# Patient Record
Sex: Male | Born: 1980 | Race: Black or African American | Hispanic: No | Marital: Single | Smoking: Never smoker
Health system: Southern US, Community
[De-identification: ages and names within clinical notes are randomized; demographics above are authoritative.]

## PROBLEM LIST (undated history)

## (undated) DIAGNOSIS — I10 Essential (primary) hypertension: Secondary | ICD-10-CM

## (undated) DIAGNOSIS — E785 Hyperlipidemia, unspecified: Secondary | ICD-10-CM

## (undated) DIAGNOSIS — Z21 Asymptomatic human immunodeficiency virus [HIV] infection status: Secondary | ICD-10-CM

## (undated) DIAGNOSIS — B20 Human immunodeficiency virus [HIV] disease: Secondary | ICD-10-CM

## (undated) HISTORY — DX: Human immunodeficiency virus (HIV) disease: B20

## (undated) HISTORY — DX: Essential (primary) hypertension: I10

## (undated) HISTORY — DX: Hyperlipidemia, unspecified: E78.5

## (undated) HISTORY — DX: Asymptomatic human immunodeficiency virus (hiv) infection status: Z21

---

## 2007-05-27 ENCOUNTER — Ambulatory Visit: Payer: Self-pay | Admitting: Internal Medicine

## 2007-05-27 DIAGNOSIS — B20 Human immunodeficiency virus [HIV] disease: Secondary | ICD-10-CM | POA: Insufficient documentation

## 2007-05-27 DIAGNOSIS — J45909 Unspecified asthma, uncomplicated: Secondary | ICD-10-CM | POA: Insufficient documentation

## 2007-05-27 LAB — CONVERTED CEMR LAB
HCV Ab: NEGATIVE
HIV-1 antibody: POSITIVE — AB
HIV-2 Ab: NEGATIVE
HIV: REACTIVE
Hemoglobin, Urine: NEGATIVE
Hep B S Ab: POSITIVE — AB
Hepatitis B Surface Ag: NEGATIVE
Ketones, ur: NEGATIVE mg/dL
Leukocytes, UA: NEGATIVE
Nitrite: NEGATIVE
Protein, ur: NEGATIVE mg/dL
Urobilinogen, UA: 0.2 (ref 0.0–1.0)
pH: 7.5 (ref 5.0–8.0)

## 2007-06-12 ENCOUNTER — Ambulatory Visit: Payer: Self-pay | Admitting: Internal Medicine

## 2007-06-12 ENCOUNTER — Telehealth: Payer: Self-pay | Admitting: Internal Medicine

## 2007-06-25 ENCOUNTER — Telehealth: Payer: Self-pay | Admitting: Internal Medicine

## 2007-07-16 ENCOUNTER — Encounter: Admission: RE | Admit: 2007-07-16 | Discharge: 2007-07-16 | Payer: Self-pay | Admitting: Internal Medicine

## 2007-07-16 ENCOUNTER — Ambulatory Visit: Payer: Self-pay | Admitting: Internal Medicine

## 2007-07-16 LAB — CONVERTED CEMR LAB
ALT: 17 units/L (ref 0–53)
AST: 13 units/L (ref 0–37)
Basophils Absolute: 0 10*3/uL (ref 0.0–0.1)
Basophils Relative: 1 % (ref 0–1)
CO2: 24 meq/L (ref 19–32)
Calcium: 9.2 mg/dL (ref 8.4–10.5)
Chloride: 105 meq/L (ref 96–112)
Creatinine, Ser: 0.96 mg/dL (ref 0.40–1.50)
Hemoglobin: 13.6 g/dL (ref 13.0–17.0)
Lymphocytes Relative: 52 % — ABNORMAL HIGH (ref 12–46)
MCHC: 32.7 g/dL (ref 30.0–36.0)
Monocytes Absolute: 0.4 10*3/uL (ref 0.2–0.7)
Neutro Abs: 1.4 10*3/uL — ABNORMAL LOW (ref 1.7–7.7)
Neutrophils Relative %: 36 % — ABNORMAL LOW (ref 43–77)
Platelets: 223 10*3/uL (ref 150–400)
RDW: 13.9 % (ref 11.5–14.0)
Sodium: 139 meq/L (ref 135–145)
Total Protein: 7.3 g/dL (ref 6.0–8.3)

## 2007-07-21 ENCOUNTER — Telehealth: Payer: Self-pay | Admitting: Internal Medicine

## 2007-08-14 ENCOUNTER — Ambulatory Visit: Payer: Self-pay | Admitting: Internal Medicine

## 2007-08-20 ENCOUNTER — Telehealth: Payer: Self-pay | Admitting: Internal Medicine

## 2007-09-21 ENCOUNTER — Telehealth: Payer: Self-pay | Admitting: Internal Medicine

## 2007-09-23 ENCOUNTER — Encounter: Payer: Self-pay | Admitting: Internal Medicine

## 2007-09-30 ENCOUNTER — Encounter (INDEPENDENT_AMBULATORY_CARE_PROVIDER_SITE_OTHER): Payer: Self-pay | Admitting: *Deleted

## 2007-10-17 ENCOUNTER — Encounter: Payer: Self-pay | Admitting: Internal Medicine

## 2007-10-26 ENCOUNTER — Telehealth: Payer: Self-pay | Admitting: Internal Medicine

## 2007-11-09 ENCOUNTER — Ambulatory Visit: Payer: Self-pay | Admitting: Internal Medicine

## 2007-11-09 ENCOUNTER — Encounter: Admission: RE | Admit: 2007-11-09 | Discharge: 2007-11-09 | Payer: Self-pay | Admitting: Internal Medicine

## 2007-11-09 LAB — CONVERTED CEMR LAB
ALT: 18 units/L (ref 0–53)
AST: 14 units/L (ref 0–37)
Alkaline Phosphatase: 51 units/L (ref 39–117)
BUN: 10 mg/dL (ref 6–23)
Basophils Absolute: 0 10*3/uL (ref 0.0–0.1)
Basophils Relative: 1 % (ref 0–1)
Creatinine, Ser: 0.86 mg/dL (ref 0.40–1.50)
Eosinophils Absolute: 0.1 10*3/uL (ref 0.0–0.7)
Hemoglobin: 13.3 g/dL (ref 13.0–17.0)
MCHC: 31.9 g/dL (ref 30.0–36.0)
MCV: 89.7 fL (ref 78.0–100.0)
Monocytes Absolute: 0.4 10*3/uL (ref 0.1–1.0)
Monocytes Relative: 10 % (ref 3–12)
Neutrophils Relative %: 37 % — ABNORMAL LOW (ref 43–77)
RBC: 4.65 M/uL (ref 4.22–5.81)
RDW: 13.9 % (ref 11.5–15.5)
Total Bilirubin: 0.3 mg/dL (ref 0.3–1.2)

## 2007-11-23 ENCOUNTER — Telehealth: Payer: Self-pay | Admitting: Internal Medicine

## 2007-11-27 ENCOUNTER — Ambulatory Visit: Payer: Self-pay | Admitting: Internal Medicine

## 2007-12-22 ENCOUNTER — Telehealth: Payer: Self-pay | Admitting: Internal Medicine

## 2008-01-05 ENCOUNTER — Encounter (INDEPENDENT_AMBULATORY_CARE_PROVIDER_SITE_OTHER): Payer: Self-pay | Admitting: *Deleted

## 2008-01-20 ENCOUNTER — Telehealth (INDEPENDENT_AMBULATORY_CARE_PROVIDER_SITE_OTHER): Payer: Self-pay | Admitting: *Deleted

## 2008-02-25 ENCOUNTER — Ambulatory Visit: Payer: Self-pay | Admitting: Internal Medicine

## 2008-02-25 ENCOUNTER — Encounter: Admission: RE | Admit: 2008-02-25 | Discharge: 2008-02-25 | Payer: Self-pay | Admitting: Internal Medicine

## 2008-02-25 LAB — CONVERTED CEMR LAB
Albumin: 4.6 g/dL (ref 3.5–5.2)
Alkaline Phosphatase: 56 units/L (ref 39–117)
BUN: 13 mg/dL (ref 6–23)
Creatinine, Ser: 0.88 mg/dL (ref 0.40–1.50)
Eosinophils Absolute: 0.1 10*3/uL (ref 0.0–0.7)
Eosinophils Relative: 1 % (ref 0–5)
Glucose, Bld: 83 mg/dL (ref 70–99)
HCT: 42.1 % (ref 39.0–52.0)
HDL: 32 mg/dL — ABNORMAL LOW (ref >39)
Hemoglobin: 13.6 g/dL (ref 13.0–17.0)
Lymphs Abs: 2.9 10*3/uL (ref 0.7–4.0)
MCV: 88.4 fL (ref 78.0–100.0)
Monocytes Absolute: 0.5 10*3/uL (ref 0.1–1.0)
Monocytes Relative: 10 % (ref 3–12)
Neutrophils Relative %: 30 % — ABNORMAL LOW (ref 43–77)
Potassium: 4.4 meq/L (ref 3.5–5.3)
RBC: 4.76 M/uL (ref 4.22–5.81)
Triglycerides: 401 mg/dL — ABNORMAL HIGH (ref <150)
WBC: 4.9 10*3/uL (ref 4.0–10.5)

## 2008-03-02 ENCOUNTER — Telehealth (INDEPENDENT_AMBULATORY_CARE_PROVIDER_SITE_OTHER): Payer: Self-pay | Admitting: *Deleted

## 2008-03-11 ENCOUNTER — Ambulatory Visit: Payer: Self-pay | Admitting: Internal Medicine

## 2008-03-11 DIAGNOSIS — E782 Mixed hyperlipidemia: Secondary | ICD-10-CM | POA: Insufficient documentation

## 2008-04-21 ENCOUNTER — Telehealth (INDEPENDENT_AMBULATORY_CARE_PROVIDER_SITE_OTHER): Payer: Self-pay | Admitting: *Deleted

## 2008-05-18 ENCOUNTER — Telehealth (INDEPENDENT_AMBULATORY_CARE_PROVIDER_SITE_OTHER): Payer: Self-pay | Admitting: *Deleted

## 2008-06-21 ENCOUNTER — Telehealth (INDEPENDENT_AMBULATORY_CARE_PROVIDER_SITE_OTHER): Payer: Self-pay | Admitting: *Deleted

## 2008-06-23 ENCOUNTER — Ambulatory Visit: Payer: Self-pay | Admitting: Internal Medicine

## 2008-06-23 LAB — CONVERTED CEMR LAB
AST: 13 units/L (ref 0–37)
Alkaline Phosphatase: 48 units/L (ref 39–117)
BUN: 8 mg/dL (ref 6–23)
Basophils Relative: 0 % (ref 0–1)
Creatinine, Ser: 0.96 mg/dL (ref 0.40–1.50)
Eosinophils Absolute: 0.1 10*3/uL (ref 0.0–0.7)
HDL: 36 mg/dL — ABNORMAL LOW (ref >39)
Hemoglobin: 14 g/dL (ref 13.0–17.0)
LDL Cholesterol: 126 mg/dL — ABNORMAL HIGH (ref 0–99)
MCHC: 33.4 g/dL (ref 30.0–36.0)
MCV: 88.6 fL (ref 78.0–100.0)
Monocytes Absolute: 0.3 10*3/uL (ref 0.1–1.0)
Monocytes Relative: 9 % (ref 3–12)
Neutrophils Relative %: 37 % — ABNORMAL LOW (ref 43–77)
RBC: 4.73 M/uL (ref 4.22–5.81)
Total CHOL/HDL Ratio: 5.6

## 2008-07-08 ENCOUNTER — Ambulatory Visit: Payer: Self-pay | Admitting: Internal Medicine

## 2008-07-08 DIAGNOSIS — R030 Elevated blood-pressure reading, without diagnosis of hypertension: Secondary | ICD-10-CM

## 2008-07-11 ENCOUNTER — Encounter (INDEPENDENT_AMBULATORY_CARE_PROVIDER_SITE_OTHER): Payer: Self-pay | Admitting: *Deleted

## 2008-07-19 ENCOUNTER — Telehealth (INDEPENDENT_AMBULATORY_CARE_PROVIDER_SITE_OTHER): Payer: Self-pay | Admitting: *Deleted

## 2008-08-16 ENCOUNTER — Telehealth (INDEPENDENT_AMBULATORY_CARE_PROVIDER_SITE_OTHER): Payer: Self-pay | Admitting: *Deleted

## 2008-09-19 ENCOUNTER — Telehealth (INDEPENDENT_AMBULATORY_CARE_PROVIDER_SITE_OTHER): Payer: Self-pay | Admitting: *Deleted

## 2008-10-03 ENCOUNTER — Ambulatory Visit: Payer: Self-pay | Admitting: Internal Medicine

## 2008-10-03 LAB — CONVERTED CEMR LAB
AST: 11 units/L (ref 0–37)
Albumin: 4.4 g/dL (ref 3.5–5.2)
Alkaline Phosphatase: 44 units/L (ref 39–117)
BUN: 15 mg/dL (ref 6–23)
Basophils Relative: 0 % (ref 0–1)
Eosinophils Absolute: 0.1 10*3/uL (ref 0.0–0.7)
Eosinophils Relative: 2 % (ref 0–5)
HDL: 29 mg/dL — ABNORMAL LOW (ref >39)
MCHC: 32.3 g/dL (ref 30.0–36.0)
MCV: 90.5 fL (ref 78.0–100.0)
Monocytes Relative: 8 % (ref 3–12)
Neutrophils Relative %: 36 % — ABNORMAL LOW (ref 43–77)
Platelets: 237 10*3/uL (ref 150–400)
Potassium: 4.1 meq/L (ref 3.5–5.3)
RBC: 4.72 M/uL (ref 4.22–5.81)
Total Bilirubin: 0.3 mg/dL (ref 0.3–1.2)
Total CHOL/HDL Ratio: 7.1

## 2008-10-13 ENCOUNTER — Telehealth (INDEPENDENT_AMBULATORY_CARE_PROVIDER_SITE_OTHER): Payer: Self-pay | Admitting: *Deleted

## 2008-10-21 ENCOUNTER — Ambulatory Visit: Payer: Self-pay | Admitting: Internal Medicine

## 2008-11-07 ENCOUNTER — Telehealth (INDEPENDENT_AMBULATORY_CARE_PROVIDER_SITE_OTHER): Payer: Self-pay | Admitting: *Deleted

## 2008-11-14 ENCOUNTER — Telehealth (INDEPENDENT_AMBULATORY_CARE_PROVIDER_SITE_OTHER): Payer: Self-pay | Admitting: *Deleted

## 2008-11-14 ENCOUNTER — Encounter (INDEPENDENT_AMBULATORY_CARE_PROVIDER_SITE_OTHER): Payer: Self-pay | Admitting: *Deleted

## 2008-12-13 ENCOUNTER — Telehealth (INDEPENDENT_AMBULATORY_CARE_PROVIDER_SITE_OTHER): Payer: Self-pay | Admitting: *Deleted

## 2009-01-09 ENCOUNTER — Telehealth (INDEPENDENT_AMBULATORY_CARE_PROVIDER_SITE_OTHER): Payer: Self-pay | Admitting: *Deleted

## 2009-01-19 ENCOUNTER — Ambulatory Visit: Payer: Self-pay | Admitting: Internal Medicine

## 2009-01-19 LAB — CONVERTED CEMR LAB
Alkaline Phosphatase: 44 units/L (ref 39–117)
Eosinophils Relative: 1 % (ref 0–5)
GFR calc non Af Amer: >60 mL/min (ref >60)
Glucose, Bld: 99 mg/dL (ref 70–99)
HCT: 40.4 % (ref 39.0–52.0)
HDL: 33 mg/dL — ABNORMAL LOW (ref >39)
Hemoglobin: 13.4 g/dL (ref 13.0–17.0)
LDL Cholesterol: 123 mg/dL — ABNORMAL HIGH (ref 0–99)
Lymphocytes Relative: 65 % — ABNORMAL HIGH (ref 12–46)
Lymphs Abs: 2.7 10*3/uL (ref 0.7–4.0)
Monocytes Absolute: 0.3 10*3/uL (ref 0.1–1.0)
Monocytes Relative: 8 % (ref 3–12)
RBC: 4.68 M/uL (ref 4.22–5.81)
Sodium: 137 meq/L (ref 135–145)
Total Bilirubin: 0.3 mg/dL (ref 0.3–1.2)
Total Protein: 7.5 g/dL (ref 6.0–8.3)
Triglycerides: 170 mg/dL — ABNORMAL HIGH (ref <150)
VLDL: 34 mg/dL (ref 0–40)
WBC: 4.2 10*3/uL (ref 4.0–10.5)

## 2009-02-03 ENCOUNTER — Ambulatory Visit: Payer: Self-pay | Admitting: Internal Medicine

## 2009-02-07 ENCOUNTER — Telehealth (INDEPENDENT_AMBULATORY_CARE_PROVIDER_SITE_OTHER): Payer: Self-pay | Admitting: *Deleted

## 2009-02-09 ENCOUNTER — Telehealth (INDEPENDENT_AMBULATORY_CARE_PROVIDER_SITE_OTHER): Payer: Self-pay | Admitting: *Deleted

## 2009-03-08 ENCOUNTER — Telehealth (INDEPENDENT_AMBULATORY_CARE_PROVIDER_SITE_OTHER): Payer: Self-pay | Admitting: *Deleted

## 2009-04-07 ENCOUNTER — Telehealth (INDEPENDENT_AMBULATORY_CARE_PROVIDER_SITE_OTHER): Payer: Self-pay | Admitting: *Deleted

## 2009-05-08 ENCOUNTER — Telehealth (INDEPENDENT_AMBULATORY_CARE_PROVIDER_SITE_OTHER): Payer: Self-pay | Admitting: *Deleted

## 2009-05-18 ENCOUNTER — Telehealth (INDEPENDENT_AMBULATORY_CARE_PROVIDER_SITE_OTHER): Payer: Self-pay | Admitting: *Deleted

## 2009-06-02 ENCOUNTER — Telehealth (INDEPENDENT_AMBULATORY_CARE_PROVIDER_SITE_OTHER): Payer: Self-pay | Admitting: *Deleted

## 2009-07-05 ENCOUNTER — Telehealth (INDEPENDENT_AMBULATORY_CARE_PROVIDER_SITE_OTHER): Payer: Self-pay | Admitting: *Deleted

## 2009-08-01 ENCOUNTER — Telehealth (INDEPENDENT_AMBULATORY_CARE_PROVIDER_SITE_OTHER): Payer: Self-pay | Admitting: *Deleted

## 2009-08-10 ENCOUNTER — Ambulatory Visit: Payer: Self-pay | Admitting: Internal Medicine

## 2009-08-10 LAB — CONVERTED CEMR LAB
Albumin: 4.3 g/dL (ref 3.5–5.2)
Alkaline Phosphatase: 47 units/L (ref 39–117)
BUN: 11 mg/dL (ref 6–23)
Basophils Relative: 1 % (ref 0–1)
Cholesterol: 200 mg/dL (ref 0–200)
Glucose, Bld: 116 mg/dL — ABNORMAL HIGH (ref 70–99)
HDL: 37 mg/dL — ABNORMAL LOW (ref >39)
HIV-1 RNA Quant, Log: 1.87 — ABNORMAL HIGH (ref <1.68)
Hemoglobin: 13.5 g/dL (ref 13.0–17.0)
Lymphs Abs: 1.7 10*3/uL (ref 0.7–4.0)
MCHC: 31.5 g/dL (ref 30.0–36.0)
MCV: 91.3 fL (ref >=78.0)
Monocytes Absolute: 0.3 10*3/uL (ref 0.1–1.0)
Monocytes Relative: 10 % (ref 3–12)
Neutro Abs: 1.1 10*3/uL — ABNORMAL LOW (ref 1.7–7.7)
RBC: 4.7 M/uL (ref 4.22–5.81)
Total Bilirubin: 0.3 mg/dL (ref 0.3–1.2)
Total CHOL/HDL Ratio: 5.4
VLDL: 29 mg/dL (ref 0–40)
WBC: 3.3 10*3/uL — ABNORMAL LOW (ref 4.0–10.5)

## 2009-09-01 ENCOUNTER — Ambulatory Visit: Payer: Self-pay | Admitting: Internal Medicine

## 2009-09-04 ENCOUNTER — Encounter (INDEPENDENT_AMBULATORY_CARE_PROVIDER_SITE_OTHER): Payer: Self-pay | Admitting: *Deleted

## 2009-10-06 ENCOUNTER — Telehealth (INDEPENDENT_AMBULATORY_CARE_PROVIDER_SITE_OTHER): Payer: Self-pay | Admitting: *Deleted

## 2009-10-31 ENCOUNTER — Telehealth (INDEPENDENT_AMBULATORY_CARE_PROVIDER_SITE_OTHER): Payer: Self-pay | Admitting: *Deleted

## 2010-01-04 ENCOUNTER — Telehealth (INDEPENDENT_AMBULATORY_CARE_PROVIDER_SITE_OTHER): Payer: Self-pay | Admitting: *Deleted

## 2010-02-13 ENCOUNTER — Encounter (INDEPENDENT_AMBULATORY_CARE_PROVIDER_SITE_OTHER): Payer: Self-pay | Admitting: *Deleted

## 2010-02-28 ENCOUNTER — Ambulatory Visit: Payer: Self-pay | Admitting: Internal Medicine

## 2010-02-28 LAB — CONVERTED CEMR LAB
HIV 1 RNA Quant: <48 copies/mL (ref <48)
HIV-1 RNA Quant, Log: <1.68 (ref <1.68)

## 2010-03-01 ENCOUNTER — Encounter: Payer: Self-pay | Admitting: Internal Medicine

## 2010-03-01 LAB — CONVERTED CEMR LAB
ALT: 11 units/L (ref 0–53)
Basophils Absolute: 0.1 10*3/uL (ref 0.0–0.1)
Basophils Relative: 1 % (ref 0–1)
CO2: 22 meq/L (ref 19–32)
Calcium: 8.4 mg/dL (ref 8.4–10.5)
Eosinophils Absolute: 0.1 10*3/uL (ref 0.0–0.7)
Eosinophils Relative: 2 % (ref 0–5)
HCT: 38.1 % — ABNORMAL LOW (ref 39.0–52.0)
Hemoglobin: 12.5 g/dL — ABNORMAL LOW (ref 13.0–17.0)
Lymphocytes Relative: 46 % (ref 12–46)
MCHC: 32.8 g/dL (ref 30.0–36.0)
MCV: 88 fL (ref 78.0–100.0)
Monocytes Absolute: 0.5 10*3/uL (ref 0.1–1.0)
Platelets: 278 10*3/uL (ref 150–400)
RDW: 13.4 % (ref 11.5–15.5)
Total Bilirubin: 0.2 mg/dL — ABNORMAL LOW (ref 0.3–1.2)
Total Protein: 6.9 g/dL (ref 6.0–8.3)

## 2010-03-16 ENCOUNTER — Ambulatory Visit: Payer: Self-pay | Admitting: Internal Medicine

## 2010-04-16 ENCOUNTER — Ambulatory Visit: Payer: Self-pay | Admitting: Internal Medicine

## 2010-04-16 DIAGNOSIS — R21 Rash and other nonspecific skin eruption: Secondary | ICD-10-CM | POA: Insufficient documentation

## 2010-04-17 ENCOUNTER — Encounter: Payer: Self-pay | Admitting: Internal Medicine

## 2010-04-17 LAB — CONVERTED CEMR LAB
Basophils Absolute: 0 10*3/uL (ref 0.0–0.1)
Basophils Relative: 1 % (ref 0–1)
Hemoglobin: 12.9 g/dL — ABNORMAL LOW (ref 13.0–17.0)
MCHC: 31.7 g/dL (ref 30.0–36.0)
Monocytes Absolute: 0.6 10*3/uL (ref 0.1–1.0)
Neutro Abs: 2.7 10*3/uL (ref 1.7–7.7)
RDW: 14.1 % (ref 11.5–15.5)

## 2010-04-18 ENCOUNTER — Ambulatory Visit: Payer: Self-pay | Admitting: Internal Medicine

## 2010-04-19 ENCOUNTER — Encounter: Payer: Self-pay | Admitting: Internal Medicine

## 2010-04-25 ENCOUNTER — Ambulatory Visit: Payer: Self-pay | Admitting: Internal Medicine

## 2010-04-25 LAB — CONVERTED CEMR LAB: RPR Ser Ql: REACTIVE — AB

## 2010-05-04 ENCOUNTER — Telehealth: Payer: Self-pay

## 2010-06-20 ENCOUNTER — Telehealth (INDEPENDENT_AMBULATORY_CARE_PROVIDER_SITE_OTHER): Payer: Self-pay | Admitting: *Deleted

## 2010-06-21 ENCOUNTER — Encounter: Payer: Self-pay | Admitting: Internal Medicine

## 2010-06-22 ENCOUNTER — Ambulatory Visit: Payer: Self-pay | Admitting: Internal Medicine

## 2010-06-22 LAB — CONVERTED CEMR LAB
RPR Titer: 1:32 {titer} — AB
T pallidum Antibodies (TP-PA): >8 — ABNORMAL HIGH (ref <0.90)

## 2010-06-25 ENCOUNTER — Telehealth (INDEPENDENT_AMBULATORY_CARE_PROVIDER_SITE_OTHER): Payer: Self-pay | Admitting: *Deleted

## 2010-06-26 ENCOUNTER — Ambulatory Visit: Payer: Self-pay | Admitting: Internal Medicine

## 2010-06-26 DIAGNOSIS — A539 Syphilis, unspecified: Secondary | ICD-10-CM

## 2010-07-04 ENCOUNTER — Ambulatory Visit: Payer: Self-pay | Admitting: Internal Medicine

## 2010-07-10 ENCOUNTER — Ambulatory Visit: Payer: Self-pay | Admitting: Internal Medicine

## 2010-08-06 ENCOUNTER — Telehealth: Payer: Self-pay

## 2010-10-30 NOTE — Miscellaneous (Signed)
Summary: Orders Update  Clinical Lists Changes  Orders: Added new Test order of T-Culture, Blood Routine ([id number]) - Signed     Process Orders Check Orders Results:     [facility]: ABN not required for this insurance Tests Sent for requisitioning (April 17, 2010 4:17 PM):     04/17/2010: [facility] -- T-Culture, Blood Routine [[id number]] (signed)

## 2010-10-30 NOTE — Progress Notes (Signed)
Summary: Request for antibiotic  Phone Note Call from Patient Call back at Home Phone [phone]   Caller: Patient Details for Reason: URI-Upper Respiratory Infection Summary of Call: Patient left message on voicemal in old clinic requesting an antibiotic because he thinks he has an URI.  Patient states that he has a bad cold that comes and goes.  Please call patient back at his home number. Initial call taken by: Paulo Fruit  BS,CPht II,MPH,  January 04, 2010 9:33 AM  Follow-up for Phone Call        needs appt for eval Follow-up by: Yisroel Ramming MD,  January 04, 2010 9:50 AM  Additional Follow-up for Phone Call Additional follow up Details #1::        Called pt. and offered appt for the week of 01/01/10 or pt. could be seen at urgent care.  Pt. declined to schedule appt. Additional Follow-up by: Wendall Mola CMA Duncan Dull),  January 09, 2010 9:39 AM

## 2010-10-30 NOTE — Miscellaneous (Signed)
Summary: Orders Update  Clinical Lists Changes  Orders: Added new Test order of T-RPR (Syphilis) [phone]) - Signed     Process Orders Check Orders Results:     [facility]: ABN not required for this insurance Tests Sent for requisitioning (June 21, 2010 11:45 AM):     06/21/2010: [facility] -- T-RPR (Syphilis) [phone] (signed)

## 2010-10-30 NOTE — Progress Notes (Signed)
Summary: repeat RPR  Phone Note Call from Patient   Caller: Patient Summary of Call: Pt. c/o of spots on feet that never cleared. I told him that can take quite awhile.  He said he just does not feel right, and wants to know if his RPR can be repeated or if possibly he needed more than the one dose fo PCN Initial call taken by: Wendall Mola CMA Duncan Dull),  June 20, 2010 5:03 PM  Follow-up for Phone Call        ok to repeat RPR Follow-up by: Yisroel Ramming MD,  June 20, 2010 6:09 PM     Appended Document: repeat RPR pt. notified, order put in for lab

## 2010-10-30 NOTE — Progress Notes (Signed)
Summary: Needs nurse visit  Phone Note Outgoing Call   Call placed by: Annice Pih Summary of Call: Called pt. to let him know to schedule nurse visit because he needs Bicillian x 3 weeks Initial call taken by: Wendall Mola CMA Duncan Dull),  June 25, 2010 12:39 PM

## 2010-10-30 NOTE — Assessment & Plan Note (Signed)
Summary: flu shot [mkj  Prior Medications: SUSTIVA 600 MG  TABS (EFAVIRENZ) Take 1 tablet by mouth at bedtime EPZICOM 600-300 MG  TABS (ABACAVIR SULFATE-LAMIVUDINE) Take 1 tablet by mouth once a day CLARITIN 10 MG TABS (LORATADINE) Take 1 tablet by mouth once a day PROAIR HFA 108 (90 BASE) MCG/ACT AERS (ALBUTEROL SULFATE) Inhale 1 puff every 6 hours as needed Current Allergies: No known allergies  Immunizations Administered:  Influenza Vaccine # 1:    Vaccine Type: Fluvax Non-MCR    Site: right deltoid    Mfr: Novartis    Dose: 0.5 ml    Route: IM    Given by: Wendall Mola CMA ( AAMA)    Exp. Date: 12/30/2010    Lot #: [id number]    VIS given: 04/24/10 version given June 22, 2010.  Flu Vaccine Consent Questions:    Do you have a history of severe allergic reactions to this vaccine? no    Any prior history of allergic reactions to egg and/or gelatin? no    Do you have a sensitivity to the preservative Thimersol? no    Do you have a past history of Guillan-Barre Syndrome? no    Do you currently have an acute febrile illness? no    Have you ever had a severe reaction to latex? no    Vaccine information given and explained to patient? yes  Orders Added: 1)  Influenza Vaccine NON MCR [00028]

## 2010-10-30 NOTE — Assessment & Plan Note (Signed)
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)    Route: IM    Site: RUOQ gluteus    Exp Date: 01/28/2013    Lot #: [id number]    Mfr: king    Patient tolerated injection without complications    Given by: Starleen Arms CMA (July 10, 2010 4:20 PM)  Injection # 2:    Medication: Bicillin LA 1.2 million units Injection    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)    Route: IM    Site: LUOQ gluteus    Exp Date: 01/28/2013    Lot #: [id number]    Mfr: king    Patient tolerated injection without complications    Given by: Starleen Arms CMA (July 10, 2010 4:20 PM)  Orders Added: 1)  Bicillin LA 1.2 million units Injection [J0561] 2)  Bicillin LA 1.2 million units Injection [J0561] 3)  Admin of Therapeutic Inj  intramuscular or subcutaneous [96372]   Medication Administration  Injection # 1:    Medication: Bicillin LA 1.2 million units Injection    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)    Route: IM    Site: RUOQ gluteus    Exp Date: 01/28/2013    Lot #: [id number]    Mfr: king    Patient tolerated injection without complications    Given by: Starleen Arms CMA (July 10, 2010 4:20 PM)  Injection # 2:    Medication: Bicillin LA 1.2 million units Injection    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

## 2010-10-30 NOTE — Assessment & Plan Note (Signed)
Summary: SICK/ MEDICATION PROBLEM[MKJ]   Primary Provider:  Yisroel Ramming  CC:  pt. c/o not feeling well, low grade fever, dark spots palms and soles of feet, and and genital area.  History of Present Illness: Pt c/o low grade temperature and spots on his feet and hands for a couple of weeks.  He did have a slight HA - no stifd neck.  He has just not felt himself. No tick bite that he is aware of. He did have unprotected sex about a month ago.  No urethral discharge.  Preventive Screening-Counseling & Management  Alcohol-Tobacco     Alcohol drinks/day: socially     Smoking Status: never  Caffeine-Diet-Exercise     Caffeine use/day: coffee, tea, soda (occasional)     Does Patient Exercise: no  Safety-Violence-Falls     Seat Belt Use: yes     Seat Belt Counseling: to use seat belts when in vehicle      Sexual History:  dating.    Comments: pt. declined condoms   Updated Prior Medication List: SUSTIVA 600 MG  TABS (EFAVIRENZ) Take 1 tablet by mouth at bedtime EPZICOM 600-300 MG  TABS (ABACAVIR SULFATE-LAMIVUDINE) Take 1 tablet by mouth once a day CLARITIN 10 MG TABS (LORATADINE) Take 1 tablet by mouth once a day PROAIR HFA 108 (90 BASE) MCG/ACT AERS (ALBUTEROL SULFATE) Inhale 1 puff every 6 hours as needed  Current Allergies (reviewed today): No known allergies  Past History:  Past Medical History: Last updated: 03/11/2008 Asthma HIV disease Hyperlipidemia  Social History: Sexual History:  dating  Review of Systems       The patient complains of fever.  The patient denies anorexia and weight loss.    Vital Signs:  Patient profile:   30 year old male Height:      66 inches (167.64 cm) Weight:      180.0 pounds (81.82 kg) BMI:     29.16 Temp:     98.9 degrees F (37.17 degrees C) oral Pulse rate:   99 / minute BP sitting:   152 / 95  (left arm)  Vitals Entered By: Wendall Mola CMA Duncan Dull) (April 16, 2010 2:54 PM) CC: pt. c/o not feeling well, low grade  fever, dark spots palms and soles of feet, and genital area Is Patient Diabetic? No Pain Assessment Patient in pain? yes     Location: right shoulder Intensity: 4 Type: aching Onset of pain  Intermittent Nutritional Status BMI of 25 - 29 = overweight Nutritional Status Detail appetite "normal"  Have you ever been in a relationship where you felt threatened, hurt or afraid?No   Does patient need assistance? Functional Status Self care Ambulation Normal Comments no missed doses of meds per patient    Physical Exam  General:  alert, well-developed, well-nourished, and well-hydrated.   Head:  normocephalic and atraumatic.   Mouth:  pharynx pink and moist.   Neck:  supple.   Lungs:  normal breath sounds.   Genitalia:  no ulcerations or discharge some flat hperpigmented lesions Msk:  several hyperpigmented lesions on hands and feet   Impression & Recommendations:  Problem # 1:  SKIN RASH (ICD-782.1) on hands and feet primarily with low grade fever, not toxic looking no history of tick bite so less like RMSF not toxic so do noth think it is meningococcemia most like syphilis - will check the following could be an enterovirus pt to call if severe HA or high fever Orders: T-RPR (Syphilis) (24401-02725) T- Sed  rate non-auto (40981) T-[place] Spotted Fever AB IgG IgM ([id number]) Est. Patient Level IV (21308) T-CBC w/Diff ([id number])  Patient Instructions: 1)  will call with results

## 2010-10-30 NOTE — Miscellaneous (Signed)
Summary: clinical update/ryan white apprv til 12/29/10  Clinical Lists Changes  Observations: Added new observation of AIDSDAP: Yes 2011 (02/13/2010 11:19)

## 2010-10-30 NOTE — Progress Notes (Signed)
Summary: [place] assist meds arrived for Feb  Phone Note Refill Request      Prescriptions: EPZICOM 600-300 MG  TABS (ABACAVIR SULFATE-LAMIVUDINE) Take 1 tablet by mouth once a day  #30 x 0   Entered by:   Paulo Fruit  BS,CPht II,MPH   Authorized by:   Yisroel Ramming MD   Signed by:   Paulo Fruit  BS,CPht II,MPH on 10/31/2009   Method used:   Samples Given   RxID:   [id number] SUSTIVA 600 MG  TABS (EFAVIRENZ) Take 1 tablet by mouth at bedtime  #30 x 0   Entered by:   Paulo Fruit  BS,CPht II,MPH   Authorized by:   Yisroel Ramming MD   Signed by:   Paulo Fruit  BS,CPht II,MPH on 10/31/2009   Method used:   Samples Given   RxID:   [id number]   Patient Assist Medication Verification: Medication: sustiva 600mg  QIO#[id number] Exp Date:Nov 2013 Tech approval:MLD                Patient Assist Medication Verification: Medication:Epzicom 600mg /300mg  XLK#[id number] Exp Date:Aug 2013 Tech approval:MLD Call placed to patient with message that assistance medications are ready for pick-up. Left message on patient's VM Paulo Fruit  BS,CPht II,MPH  October 31, 2009 11:50 AM

## 2010-10-30 NOTE — Progress Notes (Signed)
Summary: [facility] Call[facility] re: reactive RPR  Phone Note Other Incoming   Caller: Elfredia Nevins  Summary of Call: [facility] calling to see if pt has been treated for reactive RPR. They were informed date treated and medication given.  Tomasita Morrow RN  May 04, 2010 3:46 PM

## 2010-10-30 NOTE — Assessment & Plan Note (Signed)
Summary: 63month f/u/vs   Primary Provider:  Yisroel Ramming  CC:  follow-up visit and lab results.  History of Present Illness: Pt is concerned about the fat in his abdomen.  He wants to lose weight. He feels well and has not missed any doses of his meds.  Preventive Screening-Counseling & Management  Alcohol-Tobacco     Alcohol drinks/day: 0     Smoking Status: never  Caffeine-Diet-Exercise     Caffeine use/day: coffee, tea, soda (occasional)     Does Patient Exercise: no  Safety-Violence-Falls     Seat Belt Use: no     Seat Belt Counseling: to use seat belts when in vehicle      Sexual History:  n/a.    Comments: pt. given condoms   Updated Prior Medication List: SUSTIVA 600 MG  TABS (EFAVIRENZ) Take 1 tablet by mouth at bedtime EPZICOM 600-300 MG  TABS (ABACAVIR SULFATE-LAMIVUDINE) Take 1 tablet by mouth once a day CLARITIN 10 MG TABS (LORATADINE) Take 1 tablet by mouth once a day PROAIR HFA 108 (90 BASE) MCG/ACT AERS (ALBUTEROL SULFATE) Inhale 1 puff every 6 hours as needed  Current Allergies (reviewed today): No known allergies  Past History:  Past Medical History: Last updated: 03/11/2008 Asthma HIV disease Hyperlipidemia  Social History: Sexual History:  n/a  Review of Systems       The patient complains of weight gain.  The patient denies fever and abdominal pain.    Vital Signs:  Patient profile:   30 year old male Height:      66 inches (167.64 cm) Weight:      181.12 pounds (82.33 kg) BMI:     29.34 Temp:     98.3 degrees F (36.83 degrees C) oral Pulse rate:   87 / minute BP sitting:   115 / 81  (right arm)  Vitals Entered By: Wendall Mola CMA Duncan Dull) (March 16, 2010 10:12 AM) CC: follow-up visit, lab results Is Patient Diabetic? No Pain Assessment Patient in pain? no      Nutritional Status BMI of 25 - 29 = overweight Nutritional Status Detail appetite "good"  Does patient need assistance? Functional Status Self  care Ambulation Normal Comments no missed doses of meds per patient   Physical Exam  General:  alert, well-developed, well-nourished, and well-hydrated.   Head:  normocephalic and atraumatic.   Mouth:  pharynx pink and moist.   Lungs:  normal breath sounds.          Medication Adherence: 03/16/2010   Adherence to medications reviewed with patient. Counseling to provide adequate adherence provided   Prevention For Positives: 03/16/2010   Safe sex practices discussed with patient. Condoms offered.                             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  Pt instructed to continue the current antiretroviral regimen.  Pt encouraged to take medication regularly and not miss doses.  Pt will f/u in 3 months for repeat blood work and will see me 2 weeks later.  Diagnostics Reviewed:  HIV: REACTIVE (05/27/2007)   HIV-Western blot: Positive (05/27/2007)   CD4: 690 (03/01/2010)   WBC: 4.3 (03/01/2010)   Hgb: 12.5 (03/01/2010)   HCT: 38.1 (03/01/2010)   Platelets: 278 (03/01/2010) HIV-1 RNA: <48 copies/mL (02/28/2010)   HBSAg: NEG (05/27/2007)  Other Orders: Est.  Patient Level III [phone]) Future Orders: T-CD4SP ([facility]) (CD4SP) ... 09/12/2010 T-HIV Viral Load [phone]) ... 09/12/2010 T-Comprehensive Metabolic Panel [phone]) ... 09/12/2010 T-CBC w/Diff ([id number]) ... 09/12/2010 T-Lipid Profile [phone]) ... 09/12/2010  Patient Instructions: 1)  Please schedule a follow-up appointment in 6 months, 2 weeks after labs.

## 2010-10-30 NOTE — Assessment & Plan Note (Signed)
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)    Route: IM    Site: RUOQ gluteus    Exp Date: 01/28/2013    Lot #: [id number]    Mfr: king    Patient tolerated injection without complications    Given by: Starleen Arms CMA (July 04, 2010 4:22 PM)  Injection # 2:    Medication: Bicillin LA 1.2 million units Injection    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)    Route: IM    Site: LUOQ gluteus    Exp Date: 01/28/2013    Lot #: [id number]    Mfr: king    Patient tolerated injection without complications    Given by: Starleen Arms CMA (July 04, 2010 4:23 PM)  Orders Added: 1)  Bicillin LA 1.2 million units Injection [J0561] 2)  Bicillin LA 1.2 million units Injection [J0561] 3)  Admin of Therapeutic Inj  intramuscular or subcutaneous [96372]    Medication Administration  Injection # 1:    Medication: Bicillin LA 1.2 million units Injection    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)    Route: IM    Site: RUOQ gluteus    Exp Date: 01/28/2013    Lot #: [id number]    Mfr: king    Patient tolerated injection without complications    Given by: Starleen Arms CMA (July 04, 2010 4:22 PM)  Injection # 2:    Medication: Bicillin LA 1.2 million units Injection    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

## 2010-10-30 NOTE — Assessment & Plan Note (Signed)
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

## 2010-10-30 NOTE — Progress Notes (Signed)
Summary: [place] assist meds arrived for Jan  Phone Note Refill Request      Prescriptions: EPZICOM 600-300 MG  TABS (ABACAVIR SULFATE-LAMIVUDINE) Take 1 tablet by mouth once a day  #30 x 0   Entered by:   Paulo Fruit  BS,CPht II,MPH   Authorized by:   Yisroel Ramming MD   Signed by:   Paulo Fruit  BS,CPht II,MPH on 10/06/2009   Method used:   Samples Given   RxID:   [id number] SUSTIVA 600 MG  TABS (EFAVIRENZ) Take 1 tablet by mouth at bedtime  #30 x 0   Entered by:   Paulo Fruit  BS,CPht II,MPH   Authorized by:   Yisroel Ramming MD   Signed by:   Paulo Fruit  BS,CPht II,MPH on 10/06/2009   Method used:   Samples Given   RxID:   [id number]   Patient Assist Medication Verification: Medication:Epzicom 600/300mg  ZDG#[id number] Exp Date:Jul 2013 Tech approval:MLD                Patient Assist Medication Verification: Medication:Sustiva 600mg  Lot#[id number] Exp Date:Oct 2013 Tech approval:MLD Call placed to patient with message that assistance medications are ready for pick-up. Paulo Fruit  BS,CPht II,MPH  October 06, 2009 3:57 PM                 Appended Document: [place] assist meds arrived for Jan Prescription/Samples picked up by: patient

## 2010-10-30 NOTE — Assessment & Plan Note (Signed)
Summary: worsening rash/jc   Primary Provider:  Yisroel Ramming  CC:  pt. c/o worsening rash.  History of Present Illness: Pt continues with rash. No fever. He has felt fatigued and occasionally nauseated.  W/u has been negative other than an elevated ESR.  I will repeat the RPR today and treat him for what looks like syphilis with 2.4 million umits of PCN. Pt expressed understaning and agreement with the plan.  Preventive Screening-Counseling & Management  Alcohol-Tobacco     Alcohol drinks/day: socially     Smoking Status: never  Caffeine-Diet-Exercise     Caffeine use/day: coffee, tea, soda (occasional)     Does Patient Exercise: no  Safety-Violence-Falls     Seat Belt Use: yes     Seat Belt Counseling: to use seat belts when in vehicle      Sexual History:  dating.    Comments: pt. declined condoms   Updated Prior Medication List: SUSTIVA 600 MG  TABS (EFAVIRENZ) Take 1 tablet by mouth at bedtime EPZICOM 600-300 MG  TABS (ABACAVIR SULFATE-LAMIVUDINE) Take 1 tablet by mouth once a day CLARITIN 10 MG TABS (LORATADINE) Take 1 tablet by mouth once a day PROAIR HFA 108 (90 BASE) MCG/ACT AERS (ALBUTEROL SULFATE) Inhale 1 puff every 6 hours as needed  Current Allergies (reviewed today): No known allergies  Past History:  Past Medical History: Last updated: 03/11/2008 Asthma HIV disease Hyperlipidemia  Review of Systems  The patient denies fever, headaches, and abdominal pain.    Vital Signs:  Patient profile:   30 year old male Height:      66 inches (167.64 cm) Weight:      176.12 pounds (80.05 kg) BMI:     28.53 Temp:     98.4 degrees F (36.89 degrees C) oral Pulse rate:   106 / minute BP sitting:   151 / 93  (right arm)  Vitals Entered By: Wendall Mola CMA Duncan Dull) (April 25, 2010 2:58 PM) CC: pt. c/o worsening rash Is Patient Diabetic? No Pain Assessment Patient in pain? no      Nutritional Status BMI of 25 - 29 = overweight Nutritional Status  Detail appetite "good"  Does patient need assistance? Functional Status Self care Ambulation Normal Comments no missed doses of meds per patient   Physical Exam  General:  alert, well-developed, well-nourished, and well-hydrated.   Head:  normocephalic and atraumatic.   Skin:  hyperpigmented lesions on feet and hands   Impression & Recommendations:  Problem # 1:  SKIN RASH (ICD-782.1) Will treat for possible syphilis and repeat RPR.  Can have falsely negative RPRs. If rash persists and repeat RPR is negative will refer to Derm for biopsy. Orders: T-RPR (Syphilis) (24401-02725) Est. Patient Level III (99213) Bicillin LA 1.2 million units Injection (J0561) Bicillin LA 1.2 million units Injection (D6644) Admin of Therapeutic Inj  intramuscular or subcutaneous (03474)   Medication Administration  Injection # 1:    Medication: Bicillin LA 1.2 million units Injection    Diagnosis: SKIN RASH (ICD-782.1)    Route: IM    Site: RUOQ gluteus    Exp Date: 03/14    Lot #: [id number]    Mfr: king    Patient tolerated injection without complications    Given by: Wendall Mola CMA Duncan Dull) (April 25, 2010 4:07 PM)  Injection # 2:    Medication: Bicillin LA 1.2 million units Injection    Diagnosis: SKIN RASH (ICD-782.1)    Route: IM    Site: LUOQ  gluteus    Exp Date: 03/14    Lot #: [id number]    Mfr: king    Patient tolerated injection without complications    Given by: Wendall Mola CMA Duncan Dull) (April 25, 2010 4:07 PM)  Orders Added: 1)  T-RPR (Syphilis) [phone] 2)  Est. Patient Level III [91478] 3)  Bicillin LA 1.2 million units Injection [J0561] 4)  Bicillin LA 1.2 million units Injection [J0561] 5)  Admin of Therapeutic Inj  intramuscular or subcutaneous [29562]

## 2010-11-01 NOTE — Progress Notes (Signed)
  Phone Note Call from Patient   Caller: pat  Summary of Call: pt called to request syphillis testing.  Left message on machine. No answer upon return call.

## 2010-11-15 ENCOUNTER — Encounter: Payer: Self-pay | Admitting: Adult Health

## 2010-11-15 ENCOUNTER — Other Ambulatory Visit: Payer: Self-pay | Admitting: Adult Health

## 2010-11-15 ENCOUNTER — Other Ambulatory Visit (INDEPENDENT_AMBULATORY_CARE_PROVIDER_SITE_OTHER): Payer: Self-pay

## 2010-11-15 DIAGNOSIS — B20 Human immunodeficiency virus [HIV] disease: Secondary | ICD-10-CM

## 2010-11-15 LAB — CONVERTED CEMR LAB
BUN: 12 mg/dL (ref 6–23)
Blood, UA: NEGATIVE
CO2: 27 meq/L (ref 19–32)
Calcium: 9.4 mg/dL (ref 8.4–10.5)
Chloride: 104 meq/L (ref 96–112)
Cholesterol: 223 mg/dL — ABNORMAL HIGH (ref 0–200)
Creatinine, Ser: 0.77 mg/dL (ref 0.40–1.50)
Eosinophils Relative: 3 % (ref 0–5)
Glucose, Bld: 91 mg/dL (ref 70–99)
HCT: 40.8 % (ref 39.0–52.0)
Hemoglobin: 13.4 g/dL (ref 13.0–17.0)
Ketones, ur: NEGATIVE mg/dL
Lymphocytes Relative: 54 % — ABNORMAL HIGH (ref 12–46)
Lymphs Abs: 2.8 10*3/uL (ref 0.7–4.0)
Monocytes Absolute: 0.4 10*3/uL (ref 0.1–1.0)
Platelets: 199 10*3/uL (ref 150–400)
RPR Titer: 1:1 {titer}
Total CHOL/HDL Ratio: 7
Urine Glucose: NEGATIVE mg/dL
VLDL: 80 mg/dL — ABNORMAL HIGH (ref 0–40)
WBC: 5.2 10*3/uL (ref 4.0–10.5)
pH: 7 (ref 5.0–8.0)

## 2010-11-16 LAB — T-HELPER CELL (CD4) - (RCID CLINIC ONLY): CD4 T Cell Abs: 1120 uL (ref 400–2700)

## 2010-11-20 ENCOUNTER — Encounter (INDEPENDENT_AMBULATORY_CARE_PROVIDER_SITE_OTHER): Payer: Self-pay | Admitting: *Deleted

## 2010-11-27 NOTE — Miscellaneous (Signed)
  Clinical Lists Changes  Observations: Added new observation of HIV STATUS: HIV positive - not AIDS (11/20/2010 16:13)

## 2010-12-17 LAB — T-HELPER CELL (CD4) - (RCID CLINIC ONLY)
CD4 % Helper T Cell: 36 % (ref 33–55)
CD4 T Cell Abs: 690 uL (ref 400–2700)

## 2010-12-31 ENCOUNTER — Encounter: Payer: Self-pay | Admitting: Adult Health

## 2010-12-31 ENCOUNTER — Ambulatory Visit (INDEPENDENT_AMBULATORY_CARE_PROVIDER_SITE_OTHER): Payer: BC Managed Care – PPO | Admitting: Adult Health

## 2010-12-31 DIAGNOSIS — B20 Human immunodeficiency virus [HIV] disease: Secondary | ICD-10-CM

## 2010-12-31 DIAGNOSIS — E785 Hyperlipidemia, unspecified: Secondary | ICD-10-CM

## 2010-12-31 DIAGNOSIS — Z21 Asymptomatic human immunodeficiency virus [HIV] infection status: Secondary | ICD-10-CM

## 2010-12-31 DIAGNOSIS — K469 Unspecified abdominal hernia without obstruction or gangrene: Secondary | ICD-10-CM

## 2010-12-31 MED ORDER — OMEGA-3-ACID ETHYL ESTERS 1 G PO CAPS: 2.0000 g | ORAL_CAPSULE | Freq: Two times a day (BID) | ORAL | Status: DC

## 2010-12-31 NOTE — Progress Notes (Signed)
  Subjective:    Patient ID: Ryan [name], male    DOB: [birth year], 30 y.o.   MRN: [id number]  HPII In for lab f/u.  Basically feeling well.  Occasional episodes of mid lower abd pain while working out.  Hx of ventral hernia w/ou complication in same region.  Adherent to meds with good tolerance.    Review of Systems  Constitutional: Negative.   HENT: Negative.   Eyes: Negative.   Respiratory: Negative.   Cardiovascular: Negative.   Gastrointestinal: Positive for abdominal pain. Negative for nausea and vomiting.  Genitourinary: Negative.   Musculoskeletal: Negative.   Skin: Negative.   Neurological: Negative.   Hematological: Negative.   Psychiatric/Behavioral: Negative.        Objective:   Physical Exam  Constitutional: He is oriented to person, place, and time. He appears well-developed and well-nourished.  HENT:  Head: Normocephalic and atraumatic.  Right Ear: External ear normal.  Left Ear: External ear normal.  Nose: Nose normal.  Mouth/Throat: Oropharynx is clear and moist.  Eyes: Conjunctivae and EOM are normal. Pupils are equal, round, and reactive to light.  Neck: Normal range of motion. Neck supple.  Cardiovascular: Normal rate, regular rhythm, normal heart sounds and intact distal pulses.   Pulmonary/Chest: Effort normal and breath sounds normal.  Abdominal: Soft. Bowel sounds are normal. He exhibits no distension and no mass. There is no tenderness. There is no rebound.       Slight "give" at ventral hernia site w/o palpable bowel protrusion or mass effect.   Musculoskeletal: Normal range of motion.  Neurological: He is alert and oriented to person, place, and time. Coordination normal.  Skin: Skin is warm and dry.  Psychiatric: He has a normal mood and affect. His behavior is normal. Judgment and thought content normal.          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  Clinically stable.  Will CPM.  Recheck labs in 10 weeks and  f/u in 3 months  Dyslipidemia:  Triglycerides are 399.  Has strong family hx of elevated trigs and DBM.  Will start by encouraging a low fat carb-restricted diet.  Discussed this with food choice options in detail.  Will add Lovaza 2 gm bid with meals, and recheck fasting lipids in 10 weeks with f/u in 3 months.  Adominal hernia:  :  No indication of incarceration.  Recommend abdominal hernia support and weight lifiting belt while working out.  States was given this recommendation before but did not do this.  Will obtain these items and see if there is any improvement.

## 2011-01-09 LAB — T-HELPER CELL (CD4) - (RCID CLINIC ONLY)
CD4 % Helper T Cell: 31 % — ABNORMAL LOW (ref 33–55)
CD4 T Cell Abs: 770 uL (ref 400–2700)

## 2011-01-14 LAB — T-HELPER CELL (CD4) - (RCID CLINIC ONLY): CD4 T Cell Abs: 960 uL (ref 400–2700)

## 2011-04-08 ENCOUNTER — Other Ambulatory Visit (INDEPENDENT_AMBULATORY_CARE_PROVIDER_SITE_OTHER): Payer: BC Managed Care – PPO

## 2011-04-08 ENCOUNTER — Other Ambulatory Visit: Payer: Self-pay | Admitting: Infectious Diseases

## 2011-04-08 DIAGNOSIS — B20 Human immunodeficiency virus [HIV] disease: Secondary | ICD-10-CM

## 2011-04-08 DIAGNOSIS — Z79899 Other long term (current) drug therapy: Secondary | ICD-10-CM

## 2011-04-08 DIAGNOSIS — E785 Hyperlipidemia, unspecified: Secondary | ICD-10-CM

## 2011-04-08 LAB — COMPREHENSIVE METABOLIC PANEL
ALT: 30 U/L (ref 0–53)
AST: 23 U/L (ref 0–37)
Chloride: 102 mEq/L (ref 96–112)
Creat: 0.91 mg/dL (ref 0.50–1.35)
Total Bilirubin: 0.3 mg/dL (ref 0.3–1.2)

## 2011-04-08 LAB — LIPID PANEL
Cholesterol: 258 mg/dL — ABNORMAL HIGH (ref 0–200)
Total CHOL/HDL Ratio: 7.8 Ratio
VLDL: 67 mg/dL — ABNORMAL HIGH (ref 0–40)

## 2011-04-09 LAB — CBC WITH DIFFERENTIAL/PLATELET
Basophils Absolute: 0 10*3/uL (ref 0.0–0.1)
Eosinophils Absolute: 0.2 10*3/uL (ref 0.0–0.7)
Eosinophils Relative: 4 % (ref 0–5)
Lymphocytes Relative: 50 % — ABNORMAL HIGH (ref 12–46)
MCV: 89.5 fL (ref 78.0–100.0)
Neutrophils Relative %: 38 % — ABNORMAL LOW (ref 43–77)
Platelets: 234 10*3/uL (ref 150–400)
RDW: 14.5 % (ref 11.5–15.5)
WBC: 5.3 10*3/uL (ref 4.0–10.5)

## 2011-04-09 LAB — T-HELPER CELL (CD4) - (RCID CLINIC ONLY)
CD4 % Helper T Cell: 38 % (ref 33–55)
CD4 T Cell Abs: 1030 uL (ref 400–2700)

## 2011-04-09 LAB — HIV-1 RNA QUANT-NO REFLEX-BLD: HIV-1 RNA Quant, Log: <1.3 {Log} (ref <1.30)

## 2011-04-22 ENCOUNTER — Telehealth: Payer: Self-pay | Admitting: *Deleted

## 2011-04-22 ENCOUNTER — Other Ambulatory Visit: Payer: Self-pay | Admitting: *Deleted

## 2011-04-22 ENCOUNTER — Ambulatory Visit (INDEPENDENT_AMBULATORY_CARE_PROVIDER_SITE_OTHER): Payer: BC Managed Care – PPO | Admitting: Adult Health

## 2011-04-22 ENCOUNTER — Encounter: Payer: Self-pay | Admitting: Adult Health

## 2011-04-22 DIAGNOSIS — B20 Human immunodeficiency virus [HIV] disease: Secondary | ICD-10-CM

## 2011-04-22 DIAGNOSIS — E785 Hyperlipidemia, unspecified: Secondary | ICD-10-CM

## 2011-04-22 MED ORDER — ROSUVASTATIN CALCIUM 20 MG PO TABS: 20.0000 mg | ORAL_TABLET | Freq: Every day | ORAL | Status: DC

## 2011-04-22 MED ORDER — ALBUTEROL SULFATE HFA 108 (90 BASE) MCG/ACT IN AERS: 2.0000 | INHALATION_SPRAY | Freq: Four times a day (QID) | RESPIRATORY_TRACT | Status: AC | PRN

## 2011-04-22 NOTE — Telephone Encounter (Signed)
Called to say that he would be slightly late for his appt this afternoon.  Jennet Maduro, RN.

## 2011-04-22 NOTE — Patient Instructions (Signed)
1.ALTERIL (OTC), 2 tablets by mouth 30 minutes before bedtime. 2. Enteric coated aspirin 81 mg one tablet daily. 3. Take LOVAZA 2 capsules twice a day with breakfast and dinner. 4. Fast 8-10 hours before your next blood draw in 10 weeks.  Cholesterol Cholesterol is a white, waxy, fat-like protein needed by your body in small amounts. The liver makes all the cholesterol you need. It is carried from the liver by the blood through the blood vessels. Deposits (plaque) may build up on blood vessel walls. This makes the arteries narrower and stiffer. Plaque increases the risk for heart attack and stroke. You cannot feel your cholesterol level even if it is very high. The only way to know is by a blood test to check your lipid (fats) levels. Once you know your cholesterol levels, you should keep a record of the test results. Work with your caregiver to to keep your levels in the desired range. WHAT THE RESULTS MEAN:  Total cholesterol is a rough measure of all the cholesterol in your blood.   LDL is the so-called bad cholesterol. This is the type that deposits cholesterol   in the walls of the arteries. You want this level to be low.   HDL is the good cholesterol because it cleans the arteries and carries the LDL away. You want this level to be high.   Triglycerides are fat that the body can either burn for energy or store. High levels are closely linked to heart disease.  DESIRED LEVELS:  Total cholesterol below 200.   LDL below 100 for people at risk, below 70 for very high risk.   HDL above 50 is good, above 60 is best.   Triglycerides below 150.  HOW TO LOWER YOUR CHOLESTEROL:  Diet.   Choose fish or white meat chicken and [place], roasted or baked. Limit fatty cuts of red meat, fried foods, and processed meats, such as sausage and lunch meat.   Eat lots of fresh fruits and vegetables. Choose whole grains, beans, pasta, potatoes and cereals.   Use only small amounts of olive, corn or  canola oils. Avoid butter, mayonnaise, shortening or palm kernel oils. Avoid foods with trans-fats.   Use skim/nonfat milk and low-fat/nonfat yogurt and cheeses. Avoid whole milk, cream, ice cream, egg yolks and cheeses. Healthy desserts include angel food cake, gingersnaps, animal crackers, hard candy, popsicles, and low-fat/nonfat frozen yogurt. Avoid pastries, cakes, pies and cookies.   Exercise.   A regular program helps decrease LDL and raises HDL.   Helps with weight control.   Do things that increase your activity level like gardening, walking, or taking the stairs.   Medication.   May be prescribed by your caregiver to help lowering cholesterol and the risk for heart disease.   You may need medicine even if your levels are normal if you have several risk factors.  HOME CARE INSTRUCTIONS  Follow your diet and exercise programs as suggested by your caregiver.   Take medications as directed.   Have blood work done when your caregiver feels it is necessary.  MAKE SURE YOU:    Understand these instructions.   Will watch your condition.   Will get help right away if you are not doing well or get worse.  Document Released: 06/11/2001 Document Re-Released: 08/29/2008 [facility] Patient Information 2011 [place], [place].Hypertension (High Blood Pressure) As your heart beats, it forces blood through your arteries. This force is your blood pressure. If the pressure is too high, it is called  hypertension (HTN) or high blood pressure. HTN is dangerous because you may have it and not know it. High blood pressure may mean that your heart has to work harder to pump blood. Your arteries may be narrow or stiff. The extra work puts you at risk for heart disease, stroke, and other problems.   Blood pressure consists of two numbers, a higher number over a lower, 110/72, for example. It is stated as "110 over 72." The ideal is below 120 for the top number (systolic) and under 80 for the bottom  (diastolic). Write down your blood pressure today. You should pay close attention to your blood pressure if you have certain conditions such as:  Heart failure.   Prior heart attack.     Diabetes    Chronic kidney disease.     Prior stroke.     Multiple risk factors for heart disease.     To see if you have HTN, your blood pressure should be measured while you are seated with your arm held at the level of the heart. It should be measured at least twice. A one-time elevated blood pressure reading (especially in the Emergency Department) does not mean that you need treatment. There may be conditions in which the blood pressure is different between your right and left arms. It is important to see your caregiver soon for a recheck. Most people have essential hypertension which means that there is not a specific cause. This type of high blood pressure may be lowered by changing lifestyle factors such as:  Stress.   Smoking.    Lack of exercise.     Excessive weight.   Drug/tobacco/alcohol use.     Eating less salt.     Most people do not have symptoms from high blood pressure until it has caused damage to the body. Effective treatment can often prevent, delay or reduce that damage. TREATMENT Treatment for high blood pressure, when a cause has been identified, is directed at the cause. There are a large number of medications to treat HTN. These fall into several categories, and your caregiver will help you select the medicines that are best for you. Medications may have side effects. You should review side effects with your caregiver. If your blood pressure stays high after you have made lifestyle changes or started on medicines,    Your medication(s) may need to be changed.   Other problems may need to be addressed.   Be certain you understand your prescriptions, and know how and when to take your medicine.   Be sure to follow up with your caregiver within the time frame advised  (usually within two weeks) to have your blood pressure rechecked and to review your medications.   If you are taking more than one medicine to lower your blood pressure, make sure you know how and at what times they should be taken. Taking two medicines at the same time can result in blood pressure that is too low.  SEEK IMMEDIATE MEDICAL CARE IF YOU DEVELOP:  A severe headache, blurred or changing vision, or confusion.   Unusual weakness or numbness, or a faint feeling.   Severe chest or abdominal pain, vomiting, or breathing problems.  MAKE SURE YOU:    Understand these instructions.   Will watch your condition.   Will get help right away if you are not doing well or get worse.  Document Released: 09/16/2005 Document Re-Released: 03/06/2010 [facility] Patient Information 2011 [place], [place].

## 2011-04-22 NOTE — Progress Notes (Signed)
  Subjective:    Patient ID: Ryan [name], male    DOB: Aug 25, 1981, 30 y.o.   MRN: [id number]  HPI Presents to clinic for routine scheduled followup. Remains adherent to his antiretrovirals with good tolerance and no complications. States he has been improving. His diet and has a regular exercise regimen. Has been complaining of having some difficulties with falling asleep at night and some chronic exertional fatigue. He also relates not taking his omega-3 supplements on a regular basis as they have caused GERD-like symptoms. However, he also relates that he did not know. He was supposed to take these with food.   Review of Systems  Constitutional: Positive for fatigue. Negative for activity change, appetite change and unexpected weight change.  HENT: Negative.   Eyes: Negative.   Respiratory: Positive for wheezing. Negative for shortness of breath.   Cardiovascular: Negative for chest pain, palpitations and leg swelling.  Gastrointestinal:       GERD like symptoms with omega-3 supplements  Genitourinary: Negative.   Musculoskeletal: Negative.   Skin: Negative.   Neurological: Negative.   Hematological: Negative.   Psychiatric/Behavioral: Negative.        Objective:   Physical Exam  Constitutional: He is oriented to person, place, and time. He appears well-developed and well-nourished. No distress.  HENT:  Head: Normocephalic and atraumatic.  Mouth/Throat: Oropharynx is clear and moist.  Eyes: Conjunctivae and EOM are normal. Pupils are equal, round, and reactive to light.  Neck: Normal range of motion. Neck supple.  Cardiovascular: Normal rate, regular rhythm, normal heart sounds and intact distal pulses.   Pulmonary/Chest: Effort normal and breath sounds normal.  Abdominal: Soft.  Musculoskeletal: Normal range of motion.  Neurological: He is alert and oriented to person, place, and time. He has normal reflexes.  Skin: Skin is warm and dry.  Psychiatric: He has a normal mood  and affect. His behavior is normal. Judgment and thought content normal.          Assessment & Plan:  1. HIV.labs obtained 04/08/2011 show a CD4 count of 1030 at 38% with a viral load of less than 20 copies per mL. Clinically stable on current regimen. We'll recommend at this time to continue present management. However, if on the next assessment of labs, we indicate still elevations in serum cholesterol, triglycerides, we may want to consider another agent besides Sustiva and see, if that helps improve his lipids profile. We will ask that he repeat labs in 10 weeks and followup in 3 months.  2. Dyslipidemia. From 04/08/2011, his total cholesterol, was up to 258 mg/dL triglycerides were 409 mg/dL, HDL was down, and 33 mg/dL, LDL calculated at 811 mg/dL, his VLDL was 67 mg/dL. It is apparent that diet and exercise modifications have not been successful in the situation. We will ask that he take his omega-3 supplements 2 capsules twice a day with breakfast and dinner. We will also add Crestor 20 mg by mouth daily (a higher dose is ordered as a result of long-term use with Sustiva and its inducer activity.). He is also instructed to begin enteric-coated ASA 81 mg by mouth daily.  3. Sleep Pattern Disturbances. Suggested Alteril (OTC) 2 tablets by mouth 30 minutes before bedtime.  Verbally acknowledged all information that was provided to him and agreed with plan of care.

## 2011-05-20 ENCOUNTER — Other Ambulatory Visit: Payer: Self-pay | Admitting: *Deleted

## 2011-05-20 DIAGNOSIS — B20 Human immunodeficiency virus [HIV] disease: Secondary | ICD-10-CM

## 2011-05-20 MED ORDER — ABACAVIR SULFATE-LAMIVUDINE 600-300 MG PO TABS: 1.0000 | ORAL_TABLET | Freq: Every day | ORAL | Status: DC

## 2011-06-17 ENCOUNTER — Other Ambulatory Visit: Payer: Self-pay | Admitting: *Deleted

## 2011-06-17 DIAGNOSIS — B20 Human immunodeficiency virus [HIV] disease: Secondary | ICD-10-CM

## 2011-06-17 MED ORDER — EFAVIRENZ 600 MG PO TABS: 600.0000 mg | ORAL_TABLET | Freq: Every day | ORAL | Status: DC

## 2011-06-18 ENCOUNTER — Emergency Department (HOSPITAL_COMMUNITY)
Admission: EM | Admit: 2011-06-18 | Discharge: 2011-06-19 | Disposition: A | Discharge status: Home / Self Care | Payer: BC Managed Care – PPO | Attending: Emergency Medicine | Admitting: Emergency Medicine

## 2011-06-18 DIAGNOSIS — E860 Dehydration: Secondary | ICD-10-CM | POA: Insufficient documentation

## 2011-06-18 DIAGNOSIS — R197 Diarrhea, unspecified: Secondary | ICD-10-CM | POA: Insufficient documentation

## 2011-06-18 DIAGNOSIS — Z21 Asymptomatic human immunodeficiency virus [HIV] infection status: Secondary | ICD-10-CM | POA: Insufficient documentation

## 2011-06-18 DIAGNOSIS — R109 Unspecified abdominal pain: Secondary | ICD-10-CM | POA: Insufficient documentation

## 2011-06-18 DIAGNOSIS — R112 Nausea with vomiting, unspecified: Secondary | ICD-10-CM | POA: Insufficient documentation

## 2011-06-18 LAB — POCT I-STAT, CHEM 8
Calcium, Ion: 1.17 mmol/L (ref 1.12–1.32)
Chloride: 102 mEq/L (ref 96–112)
HCT: 49 % (ref 39.0–52.0)
Sodium: 139 mEq/L (ref 135–145)
TCO2: 25 mmol/L (ref 0–100)

## 2011-06-26 LAB — T-HELPER CELL (CD4) - (RCID CLINIC ONLY): CD4 % Helper T Cell: 31 — ABNORMAL LOW

## 2011-07-01 LAB — T-HELPER CELL (CD4) - (RCID CLINIC ONLY)
CD4 % Helper T Cell: 35
CD4 T Cell Abs: 610

## 2011-07-10 LAB — T-HELPER CELL (CD4) - (RCID CLINIC ONLY)
CD4 % Helper T Cell: 33
CD4 T Cell Abs: 670

## 2011-08-05 ENCOUNTER — Other Ambulatory Visit (INDEPENDENT_AMBULATORY_CARE_PROVIDER_SITE_OTHER): Payer: BC Managed Care – PPO

## 2011-08-05 ENCOUNTER — Other Ambulatory Visit: Payer: Self-pay | Admitting: Infectious Diseases

## 2011-08-05 ENCOUNTER — Other Ambulatory Visit: Payer: Self-pay | Admitting: Internal Medicine

## 2011-08-05 DIAGNOSIS — Z113 Encounter for screening for infections with a predominantly sexual mode of transmission: Secondary | ICD-10-CM

## 2011-08-05 DIAGNOSIS — B20 Human immunodeficiency virus [HIV] disease: Secondary | ICD-10-CM

## 2011-08-05 LAB — CBC WITH DIFFERENTIAL/PLATELET
Eosinophils Relative: 5 % (ref 0–5)
HCT: 42.4 % (ref 39.0–52.0)
Hemoglobin: 13.9 g/dL (ref 13.0–17.0)
Lymphocytes Relative: 50 % — ABNORMAL HIGH (ref 12–46)
Lymphs Abs: 2.4 10*3/uL (ref 0.7–4.0)
MCV: 86.2 fL (ref 78.0–100.0)
Monocytes Relative: 8 % (ref 3–12)
Platelets: 248 10*3/uL (ref 150–400)
RBC: 4.92 MIL/uL (ref 4.22–5.81)
WBC: 4.7 10*3/uL (ref 4.0–10.5)

## 2011-08-05 LAB — COMPREHENSIVE METABOLIC PANEL
BUN: 10 mg/dL (ref 6–23)
CO2: 28 mEq/L (ref 19–32)
Creat: 0.9 mg/dL (ref 0.50–1.35)
Glucose, Bld: 92 mg/dL (ref 70–99)
Total Bilirubin: 0.3 mg/dL (ref 0.3–1.2)
Total Protein: 7.2 g/dL (ref 6.0–8.3)

## 2011-08-05 LAB — RPR

## 2011-09-17 ENCOUNTER — Ambulatory Visit: Payer: BC Managed Care – PPO | Admitting: Internal Medicine

## 2011-09-18 ENCOUNTER — Telehealth: Payer: Self-pay | Admitting: *Deleted

## 2011-09-18 ENCOUNTER — Ambulatory Visit: Payer: BC Managed Care – PPO | Admitting: Infectious Diseases

## 2011-09-18 NOTE — Telephone Encounter (Signed)
LM asking him to call& reschedule his appt that he missed this am

## 2011-10-03 ENCOUNTER — Ambulatory Visit (INDEPENDENT_AMBULATORY_CARE_PROVIDER_SITE_OTHER): Payer: BC Managed Care – PPO | Admitting: Infectious Diseases

## 2011-10-03 ENCOUNTER — Encounter: Payer: Self-pay | Admitting: Infectious Diseases

## 2011-10-03 DIAGNOSIS — Z79899 Other long term (current) drug therapy: Secondary | ICD-10-CM

## 2011-10-03 DIAGNOSIS — K469 Unspecified abdominal hernia without obstruction or gangrene: Secondary | ICD-10-CM

## 2011-10-03 DIAGNOSIS — E785 Hyperlipidemia, unspecified: Secondary | ICD-10-CM

## 2011-10-03 DIAGNOSIS — Z113 Encounter for screening for infections with a predominantly sexual mode of transmission: Secondary | ICD-10-CM

## 2011-10-03 DIAGNOSIS — B20 Human immunodeficiency virus [HIV] disease: Secondary | ICD-10-CM

## 2011-10-03 NOTE — Assessment & Plan Note (Signed)
He has no pain and this is reducible. Only protuberant when he drinks a lot of fluid. Told him to call MD if he is not able to reduce, has worsening pain.

## 2011-10-03 NOTE — Assessment & Plan Note (Signed)
He is doing very well. He has considered changing to FDC but he felt like "if it is not broken don't fix it". He is given condoms (not sexually active). Will see him back in 6 months with labs prior.

## 2011-10-03 NOTE — Progress Notes (Signed)
  Subjective:    Patient ID: Ryan [name], male    DOB: [birth year], 31 y.o.   MRN: [id number]  HPI 31 yo M with HIV+ (dx 2002), hyperlipidemia, HTN. Taking EFV/EPZ. Was seen by CV for HTN and was started on lisinopril. Had stress test normal (2012).  CD4 820, VL <20 (08-05-11)    Review of Systems  Constitutional: Negative for appetite change and unexpected weight change.  Respiratory: Negative for cough and shortness of breath.   Gastrointestinal: Negative for diarrhea and constipation.  Genitourinary: Negative for dysuria.       Objective:   Physical Exam  Constitutional: He appears well-developed and well-nourished.  HENT:  Mouth/Throat: No oropharyngeal exudate.  Eyes: EOM are normal. Pupils are equal, round, and reactive to light.  Neck: Neck supple.  Cardiovascular: Normal rate, regular rhythm and normal heart sounds.   Pulmonary/Chest: Effort normal and breath sounds normal. No respiratory distress. He has no wheezes.  Abdominal: Soft. Bowel sounds are normal. He exhibits no distension. There is no tenderness.  Lymphadenopathy:    He has no cervical adenopathy.          Assessment & Plan:

## 2011-10-03 NOTE — Assessment & Plan Note (Signed)
Will re-asses at f/u. He may need statin. He previously quit after seeing CV.

## 2011-12-06 ENCOUNTER — Other Ambulatory Visit: Payer: Self-pay | Admitting: *Deleted

## 2011-12-06 DIAGNOSIS — B20 Human immunodeficiency virus [HIV] disease: Secondary | ICD-10-CM

## 2011-12-06 MED ORDER — EFAVIRENZ 600 MG PO TABS: 600.0000 mg | ORAL_TABLET | Freq: Every day | ORAL | Status: DC

## 2012-01-20 ENCOUNTER — Other Ambulatory Visit: Payer: Self-pay | Admitting: *Deleted

## 2012-01-20 DIAGNOSIS — B20 Human immunodeficiency virus [HIV] disease: Secondary | ICD-10-CM

## 2012-01-20 MED ORDER — ABACAVIR SULFATE-LAMIVUDINE 600-300 MG PO TABS: 1.0000 | ORAL_TABLET | Freq: Every day | ORAL | Status: DC

## 2012-04-09 ENCOUNTER — Other Ambulatory Visit: Payer: Self-pay | Admitting: Infectious Diseases

## 2012-04-09 DIAGNOSIS — Z113 Encounter for screening for infections with a predominantly sexual mode of transmission: Secondary | ICD-10-CM

## 2012-04-10 ENCOUNTER — Other Ambulatory Visit: Payer: BC Managed Care – PPO

## 2012-04-10 DIAGNOSIS — Z79899 Other long term (current) drug therapy: Secondary | ICD-10-CM

## 2012-04-10 DIAGNOSIS — Z113 Encounter for screening for infections with a predominantly sexual mode of transmission: Secondary | ICD-10-CM

## 2012-04-10 DIAGNOSIS — B20 Human immunodeficiency virus [HIV] disease: Secondary | ICD-10-CM

## 2012-04-10 LAB — COMPREHENSIVE METABOLIC PANEL
AST: 18 U/L (ref 0–37)
Albumin: 4.2 g/dL (ref 3.5–5.2)
Alkaline Phosphatase: 61 U/L (ref 39–117)
Glucose, Bld: 86 mg/dL (ref 70–99)
Potassium: 4.1 mEq/L (ref 3.5–5.3)
Sodium: 140 mEq/L (ref 135–145)
Total Protein: 7.7 g/dL (ref 6.0–8.3)

## 2012-04-10 LAB — T-HELPER CELL (CD4) - (RCID CLINIC ONLY)
CD4 % Helper T Cell: 35 % (ref 33–55)
CD4 T Cell Abs: 760 uL (ref 400–2700)

## 2012-04-10 LAB — CBC
Hemoglobin: 14.1 g/dL (ref 13.0–17.0)
MCHC: 35.1 g/dL (ref 30.0–36.0)
RDW: 13.7 % (ref 11.5–15.5)

## 2012-04-10 LAB — RPR

## 2012-04-10 LAB — LIPID PANEL: LDL Cholesterol: 131 mg/dL — ABNORMAL HIGH (ref 0–99)

## 2012-04-13 LAB — HIV-1 RNA QUANT-NO REFLEX-BLD
HIV 1 RNA Quant: <20 copies/mL (ref <20)
HIV-1 RNA Quant, Log: <1.3 {Log} (ref <1.30)

## 2012-05-06 ENCOUNTER — Encounter: Payer: Self-pay | Admitting: Infectious Diseases

## 2012-05-06 ENCOUNTER — Ambulatory Visit (INDEPENDENT_AMBULATORY_CARE_PROVIDER_SITE_OTHER): Payer: BC Managed Care – PPO | Admitting: Infectious Diseases

## 2012-05-06 VITALS — BP 140/99 | HR 81 | Temp 98.2°F | Ht 66.0 in | Wt 182.0 lb

## 2012-05-06 DIAGNOSIS — B20 Human immunodeficiency virus [HIV] disease: Secondary | ICD-10-CM

## 2012-05-06 DIAGNOSIS — E785 Hyperlipidemia, unspecified: Secondary | ICD-10-CM

## 2012-05-06 DIAGNOSIS — R030 Elevated blood-pressure reading, without diagnosis of hypertension: Secondary | ICD-10-CM

## 2012-05-06 DIAGNOSIS — Z79899 Other long term (current) drug therapy: Secondary | ICD-10-CM

## 2012-05-06 DIAGNOSIS — Z113 Encounter for screening for infections with a predominantly sexual mode of transmission: Secondary | ICD-10-CM

## 2012-05-06 DIAGNOSIS — K469 Unspecified abdominal hernia without obstruction or gangrene: Secondary | ICD-10-CM

## 2012-05-06 NOTE — Assessment & Plan Note (Signed)
Easily reducible, offered to refer him surgery (defered). Educated him about warning signs- inability to reduce, persistent pain. Will continue to follow.

## 2012-05-06 NOTE — Assessment & Plan Note (Signed)
Will cont to watch. Pt is trying to adjust his lisinopril.

## 2012-05-06 NOTE — Assessment & Plan Note (Signed)
Counseled pt about starting lipid lowering agent. He will consider, f/u at next visit.

## 2012-05-06 NOTE — Progress Notes (Signed)
  Subjective:    Patient ID: Ryan [name], male    DOB: 17-Sep-1981, 31 y.o.   MRN: [id number]  HPI 31 yo M with HIV+ (dx 2002), hyperlipidemia, HTN. Taking EFV/EPZ as well as lisinopril. Has been exercising more (wt gone up some, muscle mass). Has quit lisinopril. No probs with ART, sometimes thinks about atripla.  Has occas probs with hernia- sticking out when stomach full. Not painful.   HIV 1 RNA Quant (copies/mL)  Date Value  04/10/2012 <20   08/05/2011 <20   04/08/2011 <20      CD4 T Cell Abs (cmm)  Date Value  04/10/2012 760   08/05/2011 890   04/08/2011 1030       Review of Systems  Constitutional: Negative for appetite change and unexpected weight change.  Cardiovascular: Negative for chest pain.  Gastrointestinal: Negative for diarrhea and constipation.  Genitourinary: Negative for dysuria.  Neurological: Negative for headaches.       Objective:   Physical Exam  Constitutional: He appears well-developed and well-nourished.  HENT:  Mouth/Throat: No oropharyngeal exudate.  Eyes: EOM are normal. Pupils are equal, round, and reactive to light.  Neck: Neck supple.  Cardiovascular: Normal rate, regular rhythm and normal heart sounds.   Pulmonary/Chest: Effort normal and breath sounds normal.  Abdominal: Soft. Bowel sounds are normal. He exhibits no distension. There is no tenderness.  Lymphadenopathy:    He has no cervical adenopathy.          Assessment & Plan:

## 2012-05-06 NOTE — Assessment & Plan Note (Signed)
Doing very well. Still wishes to stay on 2 pills rather than atripla. Offered/refused condoms. Will see him back in 6 months.

## 2012-05-16 ENCOUNTER — Other Ambulatory Visit: Payer: Self-pay | Admitting: Infectious Diseases

## 2012-08-12 ENCOUNTER — Other Ambulatory Visit: Payer: Self-pay | Admitting: Infectious Diseases

## 2012-10-03 ENCOUNTER — Other Ambulatory Visit: Payer: Self-pay | Admitting: Infectious Diseases

## 2012-11-13 ENCOUNTER — Other Ambulatory Visit: Payer: Self-pay | Admitting: Infectious Diseases

## 2012-11-19 ENCOUNTER — Other Ambulatory Visit: Payer: BC Managed Care – PPO

## 2012-11-19 DIAGNOSIS — Z79899 Other long term (current) drug therapy: Secondary | ICD-10-CM

## 2012-11-19 DIAGNOSIS — Z113 Encounter for screening for infections with a predominantly sexual mode of transmission: Secondary | ICD-10-CM

## 2012-11-19 DIAGNOSIS — B20 Human immunodeficiency virus [HIV] disease: Secondary | ICD-10-CM

## 2012-11-19 LAB — CBC
Hemoglobin: 13.8 g/dL (ref 13.0–17.0)
MCH: 28.2 pg (ref 26.0–34.0)
MCHC: 34.2 g/dL (ref 30.0–36.0)

## 2012-11-20 LAB — COMPREHENSIVE METABOLIC PANEL
Albumin: 4.6 g/dL (ref 3.5–5.2)
Alkaline Phosphatase: 64 U/L (ref 39–117)
CO2: 24 mEq/L (ref 19–32)
Glucose, Bld: 98 mg/dL (ref 70–99)
Potassium: 4.3 mEq/L (ref 3.5–5.3)
Sodium: 140 mEq/L (ref 135–145)
Total Protein: 7.6 g/dL (ref 6.0–8.3)

## 2012-11-20 LAB — RPR

## 2012-11-22 LAB — HIV-1 RNA QUANT-NO REFLEX-BLD: HIV 1 RNA Quant: <20 copies/mL (ref <20)

## 2012-12-17 ENCOUNTER — Other Ambulatory Visit: Payer: Self-pay | Admitting: Infectious Diseases

## 2013-01-04 ENCOUNTER — Encounter: Payer: Self-pay | Admitting: Infectious Diseases

## 2013-01-04 ENCOUNTER — Ambulatory Visit (INDEPENDENT_AMBULATORY_CARE_PROVIDER_SITE_OTHER): Payer: BC Managed Care – PPO | Admitting: Infectious Diseases

## 2013-01-04 VITALS — BP 144/98 | HR 92 | Temp 99.0°F | Ht 66.0 in | Wt 210.0 lb

## 2013-01-04 DIAGNOSIS — E785 Hyperlipidemia, unspecified: Secondary | ICD-10-CM

## 2013-01-04 DIAGNOSIS — R030 Elevated blood-pressure reading, without diagnosis of hypertension: Secondary | ICD-10-CM

## 2013-01-04 DIAGNOSIS — Z23 Encounter for immunization: Secondary | ICD-10-CM

## 2013-01-04 DIAGNOSIS — B20 Human immunodeficiency virus [HIV] disease: Secondary | ICD-10-CM

## 2013-01-04 MED ORDER — LISINOPRIL-HYDROCHLOROTHIAZIDE 20-12.5 MG PO TABS: 1.0000 | ORAL_TABLET | Freq: Every day | ORAL | Status: DC

## 2013-01-04 NOTE — Assessment & Plan Note (Signed)
He wants to cont to try diet and exercise. Will continue to watch.

## 2013-01-04 NOTE — Assessment & Plan Note (Signed)
He is doing very well. Offered/refused condoms. Will cont his current meds (EFV/EPZ). Will see him back in 6 months with labs prior.

## 2013-01-04 NOTE — Progress Notes (Signed)
Subjective:    Patient ID: Ryan [name], male    DOB: [birth year], 32 y.o.   MRN: [id number]  HPI 32 yo M with hx of HTN, hyperlipidemia, HIV+.  Taking EFV/EPZ.  Has been taking BP qday at work- has had his lisinopril increased by PCP (20mg /day). Denies missed doses.   HIV 1 RNA Quant (copies/mL)  Date Value  11/19/2012 <20   04/10/2012 <20   08/05/2011 <20      CD4 T Cell Abs (cmm)  Date Value  11/19/2012 1120   04/10/2012 760   08/05/2011 890    Lab Results  Component Value Date   CHOL 242* 11/19/2012   HDL 32* 11/19/2012   LDLCALC Comment:   Not calculated due to Triglyceride >400. Suggest ordering Direct LDL (Unit Code: 40981).   Total Cholesterol/HDL Ratio:CHD Risk                        Coronary Heart Disease Risk Table                                        Men       Women          1/2 Average Risk              3.4        3.3              Average Risk              5.0        4.4           2X Average Risk              9.6        7.1           3X Average Risk             23.4       11.0 Use the calculated Patient Ratio above and the CHD Risk table  to determine the patient's CHD Risk. ATP III Classification (LDL):       < 100        mg/dL         Optimal      191 - 129     mg/dL         Near or Above Optimal      130 - 159     mg/dL         Borderline High      160 - 189     mg/dL         High       > 478        mg/dL         Very High   2/95/6213   TRIG 485* 11/19/2012   CHOLHDL 7.6 11/19/2012       Review of Systems  Constitutional: Negative for appetite change and unexpected weight change.  Respiratory: Positive for cough. Negative for shortness of breath.   Cardiovascular: Negative for chest pain.  Gastrointestinal: Negative for diarrhea and constipation.  Genitourinary: Negative for difficulty urinating.  Neurological: Negative for headaches.       Objective:   Physical Exam  Constitutional: He appears well-developed and well-nourished.  HENT:  Mouth/Throat: No  oropharyngeal exudate.  Eyes: EOM are normal. Pupils are  equal, round, and reactive to light.  Neck: Neck supple.  Cardiovascular: Normal rate, regular rhythm and normal heart sounds.   Pulmonary/Chest: Effort normal and breath sounds normal.  Abdominal: Soft. Bowel sounds are normal. He exhibits no distension. There is no tenderness.  Lymphadenopathy:    He has no cervical adenopathy.          Assessment & Plan:

## 2013-01-04 NOTE — Assessment & Plan Note (Signed)
Will change his ACE-I to include HCTZ.

## 2013-01-22 ENCOUNTER — Other Ambulatory Visit: Payer: Self-pay | Admitting: *Deleted

## 2013-01-22 DIAGNOSIS — B20 Human immunodeficiency virus [HIV] disease: Secondary | ICD-10-CM

## 2013-01-22 MED ORDER — ABACAVIR SULFATE-LAMIVUDINE 600-300 MG PO TABS: ORAL_TABLET | ORAL | Status: DC

## 2013-01-22 MED ORDER — EFAVIRENZ 600 MG PO TABS: ORAL_TABLET | ORAL | Status: DC

## 2013-05-10 ENCOUNTER — Telehealth: Payer: Self-pay | Admitting: Licensed Clinical Social Worker

## 2013-05-10 ENCOUNTER — Other Ambulatory Visit: Payer: Self-pay | Admitting: Licensed Clinical Social Worker

## 2013-05-10 MED ORDER — LISINOPRIL 20 MG PO TABS: 20.0000 mg | ORAL_TABLET | Freq: Every day | ORAL | Status: DC

## 2013-05-10 NOTE — Telephone Encounter (Signed)
Thank you, I changed it in epic and called it in.

## 2013-05-10 NOTE — Telephone Encounter (Signed)
Ok to switch back to plain lisinopril

## 2013-05-10 NOTE — Telephone Encounter (Signed)
Patient called complaining of joint pain, and feeling like he is having an "outer body experience " on his current HBP medication lisinopril/ hctz.  He would like to start on something different or possibly start back taking plain lisinopril.

## 2013-05-12 ENCOUNTER — Other Ambulatory Visit: Payer: Self-pay | Admitting: *Deleted

## 2013-05-12 DIAGNOSIS — I10 Essential (primary) hypertension: Secondary | ICD-10-CM

## 2013-05-12 MED ORDER — LISINOPRIL 20 MG PO TABS: 20.0000 mg | ORAL_TABLET | Freq: Every day | ORAL | Status: AC

## 2013-08-19 ENCOUNTER — Other Ambulatory Visit: Payer: BC Managed Care – PPO

## 2013-08-19 DIAGNOSIS — B20 Human immunodeficiency virus [HIV] disease: Secondary | ICD-10-CM

## 2013-08-19 DIAGNOSIS — E785 Hyperlipidemia, unspecified: Secondary | ICD-10-CM

## 2013-08-19 LAB — CBC
HCT: 38.1 % — ABNORMAL LOW (ref 39.0–52.0)
Hemoglobin: 13.2 g/dL (ref 13.0–17.0)
MCH: 29.5 pg (ref 26.0–34.0)
MCV: 85.2 fL (ref 78.0–100.0)
Platelets: 248 10*3/uL (ref 150–400)
RBC: 4.47 MIL/uL (ref 4.22–5.81)

## 2013-08-20 LAB — LIPID PANEL
HDL: 28 mg/dL — ABNORMAL LOW (ref >39)
Total CHOL/HDL Ratio: 8.6 Ratio

## 2013-08-20 LAB — COMPREHENSIVE METABOLIC PANEL
AST: 16 U/L (ref 0–37)
Albumin: 4.1 g/dL (ref 3.5–5.2)
Alkaline Phosphatase: 58 U/L (ref 39–117)
BUN: 9 mg/dL (ref 6–23)
Potassium: 4 mEq/L (ref 3.5–5.3)
Sodium: 138 mEq/L (ref 135–145)
Total Bilirubin: 0.1 mg/dL — ABNORMAL LOW (ref 0.3–1.2)
Total Protein: 7.3 g/dL (ref 6.0–8.3)

## 2013-08-20 LAB — T-HELPER CELL (CD4) - (RCID CLINIC ONLY)
CD4 % Helper T Cell: 38 % (ref 33–55)
CD4 T Cell Abs: 1300 /uL (ref 400–2700)

## 2013-10-06 ENCOUNTER — Ambulatory Visit: Payer: BC Managed Care – PPO | Admitting: Infectious Diseases

## 2013-10-13 ENCOUNTER — Encounter: Payer: Self-pay | Admitting: Infectious Diseases

## 2013-10-13 ENCOUNTER — Ambulatory Visit (INDEPENDENT_AMBULATORY_CARE_PROVIDER_SITE_OTHER): Payer: BC Managed Care – PPO | Admitting: Infectious Diseases

## 2013-10-13 VITALS — BP 146/104 | HR 90 | Temp 98.2°F | Wt 217.0 lb

## 2013-10-13 DIAGNOSIS — Z23 Encounter for immunization: Secondary | ICD-10-CM

## 2013-10-13 DIAGNOSIS — B20 Human immunodeficiency virus [HIV] disease: Secondary | ICD-10-CM

## 2013-10-13 DIAGNOSIS — E669 Obesity, unspecified: Secondary | ICD-10-CM | POA: Insufficient documentation

## 2013-10-13 DIAGNOSIS — K649 Unspecified hemorrhoids: Secondary | ICD-10-CM

## 2013-10-13 DIAGNOSIS — E785 Hyperlipidemia, unspecified: Secondary | ICD-10-CM

## 2013-10-13 DIAGNOSIS — R030 Elevated blood-pressure reading, without diagnosis of hypertension: Secondary | ICD-10-CM

## 2013-10-13 DIAGNOSIS — Z113 Encounter for screening for infections with a predominantly sexual mode of transmission: Secondary | ICD-10-CM

## 2013-10-13 NOTE — Assessment & Plan Note (Signed)
Will continue his current rx, he will cont to follow at work

## 2013-10-13 NOTE — Progress Notes (Signed)
   Subjective:    Patient ID: Ryan [name], male    DOB: 1981/09/20, 33 y.o.   MRN: [id number]  HPI 33 yo M with hx of HTN, hyperlipidemia, HIV+.  Taking EFV/EPZ.  Has been gaining wt- has gained 29#. Gets BP checked at school nurse, has been lower (had DBP 90). Takes his lisinopril.  Has not been exercising. Can't exercise at lunch now due to weather.   HIV 1 RNA Quant (copies/mL)  Date Value  08/19/2013 <20   11/19/2012 <20   04/10/2012 <20      CD4 T Cell Abs (/uL)  Date Value  08/19/2013 1300   11/19/2012 1120   04/10/2012 760       NEEDS HEP A Review of Systems  Constitutional: Positive for unexpected weight change. Negative for appetite change.  Respiratory: Negative for shortness of breath.   Cardiovascular: Negative for chest pain.  Gastrointestinal: Negative for diarrhea and constipation.  Genitourinary: Negative for difficulty urinating.  Neurological: Negative for headaches.   hemmoroids    Objective:   Physical Exam  Constitutional: He appears well-developed and well-nourished.  HENT:  Mouth/Throat: No oropharyngeal exudate.  Eyes: EOM are normal. Pupils are equal, round, and reactive to light.  Neck: Neck supple.  Cardiovascular: Normal rate, regular rhythm and normal heart sounds.   Pulmonary/Chest: Effort normal and breath sounds normal.  Abdominal: Soft. Bowel sounds are normal. He exhibits no distension. There is no tenderness.  Lymphadenopathy:    He has no cervical adenopathy.          Assessment & Plan:

## 2013-10-13 NOTE — Assessment & Plan Note (Addendum)
He needs to watch diet and exercise. He is aware of this. I offered to have him seen by dietician but he deferred.

## 2013-10-13 NOTE — Assessment & Plan Note (Signed)
Doing very well. Flu and Hep A today. Encourage diet and exercise. He is worried he has hemmeroids, will have him eval by GI. He is offered/refuses condoms. Will see him back in 6 months.

## 2013-10-13 NOTE — Addendum Note (Signed)
Addended by: Lurlean LeydenPOOLE, Jonte Wollam F on: 10/13/2013 04:44 PM   Modules accepted: Orders

## 2013-10-19 ENCOUNTER — Ambulatory Visit: Payer: BC Managed Care – PPO | Admitting: Infectious Diseases

## 2013-12-21 ENCOUNTER — Other Ambulatory Visit: Payer: Self-pay | Admitting: Family

## 2013-12-21 DIAGNOSIS — S0990XA Unspecified injury of head, initial encounter: Secondary | ICD-10-CM

## 2013-12-21 DIAGNOSIS — Z21 Asymptomatic human immunodeficiency virus [HIV] infection status: Secondary | ICD-10-CM

## 2013-12-21 DIAGNOSIS — R51 Headache: Secondary | ICD-10-CM

## 2013-12-22 ENCOUNTER — Ambulatory Visit
Admission: RE | Admit: 2013-12-22 | Discharge: 2013-12-22 | Disposition: A | Discharge status: Home / Self Care | Payer: BC Managed Care – PPO | Source: Ambulatory Visit | Attending: Family | Admitting: Family

## 2013-12-22 DIAGNOSIS — S0990XA Unspecified injury of head, initial encounter: Secondary | ICD-10-CM

## 2013-12-22 DIAGNOSIS — R51 Headache: Secondary | ICD-10-CM

## 2013-12-22 DIAGNOSIS — Z21 Asymptomatic human immunodeficiency virus [HIV] infection status: Secondary | ICD-10-CM

## 2013-12-22 MED ORDER — GADOBENATE DIMEGLUMINE 529 MG/ML IV SOLN
20.0000 mL | Freq: Once | INTRAVENOUS | Status: AC | PRN
  Administered 2013-12-22: 20 mL via INTRAVENOUS

## 2014-02-08 ENCOUNTER — Other Ambulatory Visit: Payer: Self-pay | Admitting: Family

## 2014-02-08 ENCOUNTER — Other Ambulatory Visit: Payer: Self-pay | Admitting: *Deleted

## 2014-02-08 DIAGNOSIS — B20 Human immunodeficiency virus [HIV] disease: Secondary | ICD-10-CM

## 2014-02-08 DIAGNOSIS — N5089 Other specified disorders of the male genital organs: Secondary | ICD-10-CM

## 2014-02-08 MED ORDER — ABACAVIR SULFATE-LAMIVUDINE 600-300 MG PO TABS: ORAL_TABLET | ORAL | Status: DC

## 2014-02-08 MED ORDER — EFAVIRENZ 600 MG PO TABS: ORAL_TABLET | ORAL | Status: DC

## 2014-02-10 ENCOUNTER — Encounter (INDEPENDENT_AMBULATORY_CARE_PROVIDER_SITE_OTHER): Payer: Self-pay | Admitting: General Surgery

## 2014-02-14 ENCOUNTER — Ambulatory Visit
Admission: RE | Admit: 2014-02-14 | Discharge: 2014-02-14 | Disposition: A | Discharge status: Home / Self Care | Payer: BC Managed Care – PPO | Source: Ambulatory Visit | Attending: Family | Admitting: Family

## 2014-02-14 DIAGNOSIS — N5089 Other specified disorders of the male genital organs: Secondary | ICD-10-CM

## 2014-02-22 ENCOUNTER — Ambulatory Visit (INDEPENDENT_AMBULATORY_CARE_PROVIDER_SITE_OTHER): Payer: BC Managed Care – PPO | Admitting: Surgery

## 2014-03-01 ENCOUNTER — Ambulatory Visit (INDEPENDENT_AMBULATORY_CARE_PROVIDER_SITE_OTHER): Payer: BC Managed Care – PPO | Admitting: General Surgery

## 2014-03-24 ENCOUNTER — Ambulatory Visit (INDEPENDENT_AMBULATORY_CARE_PROVIDER_SITE_OTHER): Payer: BC Managed Care – PPO | Admitting: General Surgery

## 2014-03-31 ENCOUNTER — Encounter (INDEPENDENT_AMBULATORY_CARE_PROVIDER_SITE_OTHER): Payer: Self-pay | Admitting: General Surgery

## 2014-03-31 ENCOUNTER — Ambulatory Visit (INDEPENDENT_AMBULATORY_CARE_PROVIDER_SITE_OTHER): Payer: BC Managed Care – PPO | Admitting: General Surgery

## 2014-03-31 VITALS — BP 122/80 | HR 105 | Temp 97.8°F | Resp 20 | Ht 66.0 in | Wt 213.0 lb

## 2014-03-31 DIAGNOSIS — K439 Ventral hernia without obstruction or gangrene: Secondary | ICD-10-CM

## 2014-03-31 DIAGNOSIS — K469 Unspecified abdominal hernia without obstruction or gangrene: Secondary | ICD-10-CM

## 2014-03-31 NOTE — Progress Notes (Signed)
Patient ID: Ryan [name], male   DOB: 16-Mar-1981, 33 y.o.   Ryan [name]: [id number]  Chief Complaint  Patient presents with  . Hernia    HPI Ryan [name] is a 33 y.o. male.   HPI 33 year old obese African American male is referred by Boneta LucksJennifer [name], nurse practitioner, for evaluation of a ventral hernia. The patient states it has been there for years however it has recently started bothering him a few months back. It is more noticeable after eating and drinking. It is always reducible. It is just causing more discomfort. It has never been hard or swollen. He denies any diarrhea or constipation. He denies any prior abdominal surgery. He states unfortunately he has gained about 30 pounds over the past 2 years. It is interfering with his ability to work out because it bothers him during physical activity. 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 Past Medical History  Diagnosis Date  . Hyperlipidemia   . HIV infection   . Asthma   . Hypertension     History reviewed. No pertinent past surgical history.  Family History  Problem Relation Age of Onset  . Asthma Father   . Hypertension Father     Social History History  Substance Use Topics  . Smoking status: Never Smoker   . Smokeless tobacco: Never Used  . Alcohol Use: No    No Known Allergies  Current Outpatient Prescriptions  Medication Sig Dispense Refill  . abacavir-lamiVUDine (EPZICOM) 600-300 MG per tablet TAKE 1 TABLET BY MOUTH DAILY  30 tablet  11  . albuterol (PROAIR HFA) 108 (90 BASE) MCG/ACT inhaler Inhale 2 puffs into the lungs every 6 (six) hours as needed.  1 Inhaler  11  . Cetirizine HCl (ZYRTEC ALLERGY) 10 MG CAPS Take 1 capsule by mouth daily.      Marland Kitchen. efavirenz (SUSTIVA) 600 MG tablet TAKE 1 TABLET BY MOUTH AT BEDTIME  30 tablet  11  . lisinopril (PRINIVIL,ZESTRIL) 20 MG tablet Take 1 tablet (20 mg total) by mouth daily.  30 tablet  3   No current facility-administered medications for this visit.    Review  of Systems Review of Systems  Constitutional: Negative for fever, chills, appetite change and unexpected weight change.  HENT: Negative for congestion and trouble swallowing.   Eyes: Negative for visual disturbance.  Respiratory: Negative for chest tightness and shortness of breath.   Cardiovascular: Negative for chest pain and leg swelling.       No PND, no orthopnea, no DOE  Gastrointestinal: Negative for nausea, vomiting, abdominal pain, diarrhea and constipation.       See HPI  Genitourinary: Negative for dysuria and hematuria.  Musculoskeletal: Negative.   Skin: Negative for rash.  Neurological: Negative for seizures and speech difficulty.  Hematological: Does not bruise/bleed easily.  Psychiatric/Behavioral: Negative for behavioral problems and confusion.    Blood pressure 122/80, pulse 105, temperature 97.8 F (36.6 C), resp. rate 20, height 5\' 6"  (1.676 [name]), weight 213 lb (96.616 kg).  Physical Exam Physical Exam  Vitals reviewed. Constitutional: He is oriented to person, place, and time. He appears well-developed and well-nourished. No distress.  obese  HENT:  Head: Normocephalic and atraumatic.  Right Ear: External ear normal.  Left Ear: External ear normal.  Eyes: Conjunctivae are normal. No scleral icterus.  Neck: Normal range of motion. Neck supple. No tracheal deviation present. No thyromegaly present.  Cardiovascular: Normal rate, normal heart sounds and intact distal pulses.   Pulmonary/Chest: Effort normal  and breath sounds normal. No respiratory distress. He has no wheezes.  Abdominal: Soft. He exhibits no distension. There is no tenderness. There is no rebound and no guarding.    Pt examined supine and standing - +supraumbilical hernia- about 2 x2cm soft, nt, reducible. No other fascial defects  Musculoskeletal: Normal range of motion. He exhibits no edema and no tenderness.  Lymphadenopathy:    He has no cervical adenopathy.  Neurological: He is alert and  oriented to person, place, and time. He exhibits normal muscle tone.  Skin: Skin is warm and dry. No rash noted. He is not diaphoretic. No erythema. No pallor.  Psychiatric: He has a normal mood and affect. His behavior is normal. Judgment and thought content normal.    Data Reviewed Dr Ninetta LightsHatcher note with labs Boneta LucksJennifer Brown NP notes  Assessment    Supraumbilical hernia     Plan    We discussed the etiology of ventral hernias. We discussed the signs and symptoms of incarceration and strangulation. The patient was given educational material. I also drew diagrams.  We discussed nonoperative and operative management. With respect to operative management, we discussed both open repair and laparoscopic repair. We discussed the pros and cons of each approach. I discussed the typical aftercare with each procedure and how each procedure differs.  The patient has elected to laparoscopic assisted supraumbilical hernia repair with mesh  We will plan a hybrid approach in order to reapproximate the abdominal wall muscles with a mesh underlay.  We discussed the risk and benefits of surgery including but not limited to bleeding, infection, injury to surrounding structures, hernia recurrence, mesh complications, hematoma/seroma formation, need to convert to an open procedure, blood clot formation, urinary retention, post operative ileus, general anesthesia risk, long-term abdominal pain. We discussed that this procedure can be quite uncomfortable and difficult to recover from based on how the mesh is secured to the abdominal wall. We discussed the importance of avoiding heavy lifting and straining for a period of 6 weeks.   We did discuss the importance of weight loss in order to decrease his risk of recurrence.  Mary SellaEric [name]. Andrey CampanileWilson, MD, FACS General, Bariatric, & Minimally Invasive Surgery [facility] [place]        [facility] CenterWILSON,Ryan [name] 03/31/2014, 11:40 AM

## 2014-03-31 NOTE — Patient Instructions (Signed)
Work on Therapist, musicfood choices and exercise  Ventral Hernia A ventral hernia (also called an incisional hernia) is a hernia that occurs at the site of a previous surgical cut (incision) in the abdomen. The abdominal wall spans from your lower chest down to your pelvis. If the abdominal wall is weakened from a surgical incision, a hernia can occur. A hernia is a bulge of bowel or muscle tissue pushing out on the weakened part of the abdominal wall. Ventral hernias can get bigger from straining or lifting. Obese and older people are at higher risk for a ventral hernia. People who develop infections after surgery or require repeat incisions at the same site on the abdomen are also at increased risk. CAUSES  A ventral hernia occurs because of weakness in the abdominal wall at an incision site.  SYMPTOMS  Common symptoms include:  A visible bulge or lump on the abdominal wall.  Pain or tenderness around the lump.  Increased discomfort if you cough or make a sudden movement. If the hernia has blocked part of the intestine, a serious complication can occur (incarcerated or strangulated hernia). This can become a problem that requires emergency surgery because the blood flow to the blocked intestine may be cut off. Symptoms may include:  Feeling sick to your stomach (nauseous).  Throwing up (vomiting).  Stomach swelling (distention) or bloating.  Fever.  Rapid heartbeat. DIAGNOSIS  Your caregiver will take a medical history and perform a physical exam. Various tests may be ordered, such as:  Blood tests.  Urine tests.  Ultrasonography.  X-rays.  Computed tomography (CT). TREATMENT  Watchful waiting may be all that is needed for a smaller hernia that does not cause symptoms. Your caregiver may recommend the use of a supportive belt (truss) that helps to keep the abdominal wall intact. For larger hernias or those that cause pain, surgery to repair the hernia is usually recommended. If a hernia  becomes strangulated, emergency surgery needs to be done right away. HOME CARE INSTRUCTIONS  Avoid putting pressure or strain on the abdominal area.  Avoid heavy lifting.  Use good body positioning for physical tasks. Ask your caregiver about proper body positioning.  Use a supportive belt as directed by your caregiver.  Maintain a healthy weight.  Eat foods that are high in fiber, such as whole grains, fruits, and vegetables. Fiber helps prevent difficult bowel movements (constipation).  Drink enough fluids to keep your urine clear or pale yellow.  Follow up with your caregiver as directed. SEEK MEDICAL CARE IF:   Your hernia seems to be getting larger or more painful. SEEK IMMEDIATE MEDICAL CARE IF:   You have abdominal pain that is sudden and sharp.  Your pain becomes severe.  You have repeated vomiting.  You are sweating a lot.  You notice a rapid heartbeat.  You develop a fever. MAKE SURE YOU:   Understand these instructions.  Will watch your condition.  Will get help right away if you are not doing well or get worse. Document Released: 09/02/2012 Document Reviewed: 09/02/2012 [facility] Patient Information 2015 [place]. This information is not intended to replace advice given to you by your health care provider. Make sure you discuss any questions you have with your health care provider.

## 2014-04-11 ENCOUNTER — Other Ambulatory Visit: Payer: Self-pay | Admitting: Family

## 2014-04-11 ENCOUNTER — Ambulatory Visit
Admission: RE | Admit: 2014-04-11 | Discharge: 2014-04-11 | Disposition: A | Discharge status: Home / Self Care | Payer: BC Managed Care – PPO | Source: Ambulatory Visit | Attending: Family | Admitting: Family

## 2014-04-11 DIAGNOSIS — R05 Cough: Secondary | ICD-10-CM

## 2014-04-11 DIAGNOSIS — R059 Cough, unspecified: Secondary | ICD-10-CM

## 2014-04-26 ENCOUNTER — Telehealth (INDEPENDENT_AMBULATORY_CARE_PROVIDER_SITE_OTHER): Payer: Self-pay | Admitting: General Surgery

## 2014-04-26 NOTE — Telephone Encounter (Signed)
Talked to patient about surgery, went over financial responsibilities, patient will call back to schedule.

## 2014-05-24 ENCOUNTER — Encounter: Payer: BC Managed Care – PPO | Attending: Infectious Diseases | Admitting: Dietician

## 2014-05-24 ENCOUNTER — Encounter: Payer: Self-pay | Admitting: Dietician

## 2014-05-24 VITALS — Ht 66.0 in | Wt 213.1 lb

## 2014-05-24 DIAGNOSIS — Z6834 Body mass index (BMI) 34.0-34.9, adult: Secondary | ICD-10-CM | POA: Diagnosis not present

## 2014-05-24 DIAGNOSIS — Z713 Dietary counseling and surveillance: Secondary | ICD-10-CM | POA: Diagnosis present

## 2014-05-24 DIAGNOSIS — E669 Obesity, unspecified: Secondary | ICD-10-CM | POA: Insufficient documentation

## 2014-05-24 NOTE — Progress Notes (Signed)
  Medical Nutrition Therapy:  Appt start time: 1715 end time:  1815.   Assessment:  Primary concerns today: Ryan [name] is here today since he has gained weight recently (30 lbs in the past year or two).Current weight is highest. Feels like he does not overeat but does have a lot of bread, pasta, and sweets. Would like to have advice about what he should eat.  Has tried the Atkins diet and going to the gym to lose weight in the past. Has had a trainer before.   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 Works as a Electrical engineer. Lives with with Ryan [name] who does most of the food preparation at home. Eats breakfast out each day at fast food.   Preferred Learning Style:   No preference indicated   Learning Readiness:   Ready  MEDICATIONS: see list   DIETARY INTAKE:  Usual eating pattern includes 3 meals and 0 snacks per day.  Avoided foods include: tomatoes, mustard, onions    24-hr recall:  B ( AM): fast food breakfast during the week or cereal at home with fruit punch Snk ( AM): none  L ( PM): leftovers or chicken/tuna salad with cracker with water Snk ( PM): snack very rarely  D ( PM): lasagna or sausage sandwich with juice, Kool Aid or water Snk ( PM): very rare  Beverages: fruit juice, Kool Aid, water, wine sometimes on weekends (3 cups juice per day)  Usual physical activity: none  Estimated energy needs: 2200 calories 248 g carbohydrates 165 g protein 61 g fat  Progress Towards Goal(s):  In progress.   Nutritional Diagnosis:  [place]-3.3 Overweight/obesity As related to hx of large portions sizes .  As evidenced by BMI of 34.4.    Intervention:  Nutrition counseling provided. Plan: For breakfast have some protein and carbohydrates (peanut butter and toast, Egg McMuffin, yogurt (less 12 g carbohydrates) with fruit and small amount granola). Have some nuts and raisins or [facility] Protein bars or protein shake available for snacks. Use  small plates and bowls. Eat just enough to last for 3-5 hours.  Drink mostly water or decaf coffee or sugar free packet (instead of juice or punch). Add vegetables to lunch and dinner (aim for half your plate). Have lean protein the size of the palm of your hand. Limit starch to a quarter of you plate.  Buy frozen unseasoned vegetable or salad greens or baby carrots.  Aim to go to they gym 3 x week (2 weekend days and 1 x week).   Teaching Method Utilized:  Visual Auditory Hands on  Handouts given during visit include:  MyPlate Handout  15 g CHO Snacks  Supplements given during visit include:  Chocolate Premier, Lot # [id number], exp 10/2014  15 g CHO Snacks  Barriers to learning/adherence to lifestyle change: none  Demonstrated degree of understanding via:  Teach Back   Monitoring/Evaluation:  Dietary intake, exercise, and body weight in 6 week(s).

## 2014-05-24 NOTE — Patient Instructions (Signed)
For breakfast have some protein and carbohydrates (peanut butter and toast, Egg McMuffin, yogurt (less 12 g carbohydrates) with fruit and small amount granola). Have some nuts and raisins or [facility] Protein bars or protein shake available for snacks. Use small plates and bowls. Eat just enough to last for 3-5 hours.  Drink mostly water or decaf coffee or sugar free packet (instead of juice or punch). Add vegetables to lunch and dinner (aim for half your plate). Have lean protein the size of the palm of your hand. Limit starch to a quarter of you plate.  Buy frozen unseasoned vegetable or salad greens or baby carrots.  Aim to go to they gym 3 x week (2 weekend days and 1 x week).

## 2014-07-07 ENCOUNTER — Ambulatory Visit: Payer: BC Managed Care – PPO | Admitting: Dietician

## 2014-07-27 ENCOUNTER — Other Ambulatory Visit: Payer: BC Managed Care – PPO

## 2014-07-27 DIAGNOSIS — E785 Hyperlipidemia, unspecified: Secondary | ICD-10-CM

## 2014-07-27 DIAGNOSIS — Z113 Encounter for screening for infections with a predominantly sexual mode of transmission: Secondary | ICD-10-CM

## 2014-07-27 DIAGNOSIS — B20 Human immunodeficiency virus [HIV] disease: Secondary | ICD-10-CM

## 2014-07-27 LAB — CBC WITH DIFFERENTIAL/PLATELET
Basophils Absolute: 0.1 10*3/uL (ref 0.0–0.1)
Basophils Relative: 1 % (ref 0–1)
EOS PCT: 4 % (ref 0–5)
Eosinophils Absolute: 0.2 10*3/uL (ref 0.0–0.7)
HEMATOCRIT: 39.3 % (ref 39.0–52.0)
HEMOGLOBIN: 13.3 g/dL (ref 13.0–17.0)
LYMPHS PCT: 47 % — AB (ref 12–46)
Lymphs Abs: 2.4 10*3/uL (ref 0.7–4.0)
MCH: 28.1 pg (ref 26.0–34.0)
MCHC: 33.8 g/dL (ref 30.0–36.0)
MCV: 83.1 fL (ref 78.0–100.0)
MONO ABS: 0.4 10*3/uL (ref 0.1–1.0)
MONOS PCT: 8 % (ref 3–12)
Neutro Abs: 2.1 10*3/uL (ref 1.7–7.7)
Neutrophils Relative %: 40 % — ABNORMAL LOW (ref 43–77)
Platelets: 316 10*3/uL (ref 150–400)
RBC: 4.73 MIL/uL (ref 4.22–5.81)
RDW: 14.7 % (ref 11.5–15.5)
WBC: 5.2 10*3/uL (ref 4.0–10.5)

## 2014-07-27 LAB — COMPLETE METABOLIC PANEL WITH GFR
ALBUMIN: 4.7 g/dL (ref 3.5–5.2)
ALT: 20 U/L (ref 0–53)
AST: 15 U/L (ref 0–37)
Alkaline Phosphatase: 60 U/L (ref 39–117)
BUN: 10 mg/dL (ref 6–23)
CALCIUM: 10.2 mg/dL (ref 8.4–10.5)
CO2: 27 mEq/L (ref 19–32)
CREATININE: 0.84 mg/dL (ref 0.50–1.35)
Chloride: 105 mEq/L (ref 96–112)
GFR, Est African American: >89 mL/min
GFR, Est Non African American: >89 mL/min
Glucose, Bld: 91 mg/dL (ref 70–99)
Potassium: 4.2 mEq/L (ref 3.5–5.3)
Sodium: 141 mEq/L (ref 135–145)
Total Bilirubin: 0.3 mg/dL (ref 0.2–1.2)
Total Protein: 8.3 g/dL (ref 6.0–8.3)

## 2014-07-27 LAB — LIPID PANEL
CHOL/HDL RATIO: 7 ratio
Cholesterol: 231 mg/dL — ABNORMAL HIGH (ref 0–200)
HDL: 33 mg/dL — ABNORMAL LOW (ref >39)
LDL CALC: 143 mg/dL — AB (ref 0–99)
Triglycerides: 274 mg/dL — ABNORMAL HIGH (ref <150)
VLDL: 55 mg/dL — ABNORMAL HIGH (ref 0–40)

## 2014-07-28 LAB — HIV-1 RNA QUANT-NO REFLEX-BLD

## 2014-07-28 LAB — RPR

## 2014-07-29 LAB — T-HELPER CELL (CD4) - (RCID CLINIC ONLY)
CD4 % Helper T Cell: 41 % (ref 33–55)
CD4 T Cell Abs: 1010 /uL (ref 400–2700)

## 2014-07-29 LAB — URINE CYTOLOGY ANCILLARY ONLY
Chlamydia: NEGATIVE
Neisseria Gonorrhea: NEGATIVE

## 2014-08-17 ENCOUNTER — Ambulatory Visit (INDEPENDENT_AMBULATORY_CARE_PROVIDER_SITE_OTHER): Payer: BC Managed Care – PPO | Admitting: *Deleted

## 2014-08-17 ENCOUNTER — Encounter: Payer: Self-pay | Admitting: Infectious Diseases

## 2014-08-17 ENCOUNTER — Ambulatory Visit (INDEPENDENT_AMBULATORY_CARE_PROVIDER_SITE_OTHER): Payer: BC Managed Care – PPO | Admitting: Infectious Diseases

## 2014-08-17 VITALS — BP 143/85 | HR 109 | Temp 99.9°F | Wt 199.0 lb

## 2014-08-17 DIAGNOSIS — E669 Obesity, unspecified: Secondary | ICD-10-CM | POA: Diagnosis not present

## 2014-08-17 DIAGNOSIS — J4520 Mild intermittent asthma, uncomplicated: Secondary | ICD-10-CM | POA: Diagnosis not present

## 2014-08-17 DIAGNOSIS — K439 Ventral hernia without obstruction or gangrene: Secondary | ICD-10-CM

## 2014-08-17 DIAGNOSIS — E782 Mixed hyperlipidemia: Secondary | ICD-10-CM | POA: Diagnosis not present

## 2014-08-17 DIAGNOSIS — Z23 Encounter for immunization: Secondary | ICD-10-CM

## 2014-08-17 DIAGNOSIS — Z79899 Other long term (current) drug therapy: Secondary | ICD-10-CM

## 2014-08-17 DIAGNOSIS — B20 Human immunodeficiency virus [HIV] disease: Secondary | ICD-10-CM | POA: Diagnosis not present

## 2014-08-17 DIAGNOSIS — Z113 Encounter for screening for infections with a predominantly sexual mode of transmission: Secondary | ICD-10-CM

## 2014-08-17 MED ORDER — AZITHROMYCIN 250 MG PO TABS: ORAL_TABLET | ORAL | Status: AC

## 2014-08-17 MED ORDER — PREDNISONE (PAK) 10 MG PO TABS: ORAL_TABLET | Freq: Every day | ORAL | Status: AC

## 2014-08-17 NOTE — Progress Notes (Signed)
   Subjective:    Patient ID: Ryan [name], male    DOB: 26-Aug-1981, 33 y.o.   MRN: [id number]  HPI 33 yo M with hx of HTN, hyperlipidemia, HIV+. Asthma.   Taking EFV/EPZ.  Was evaluated by surgery 03-2014 for repair of umbilical hernia.  Has seen nutritionist, changed his diet. Exercising. Has lost 18# since January. Has had a "bad cold", sinus infection. Usually gets prednisone from PCP for this. Has used his inhaler 5x since this AM. Is taking allegra.   HIV 1 RNA QUANT (copies/mL)  Date Value  07/27/2014 <20  08/19/2013 <20  11/19/2012 <20   CD4 T CELL ABS  Date Value  07/27/2014 1010 /uL  08/19/2013 1300 /uL  11/19/2012 1120 cmm   Lab Results  Component Value Date   CHOL 231* 07/27/2014   HDL 33* 07/27/2014   LDLCALC 143* 07/27/2014   TRIG 274* 07/27/2014   CHOLHDL 7.0 07/27/2014    Review of Systems  Constitutional: Negative for appetite change and unexpected weight change.  HENT: Positive for postnasal drip, rhinorrhea and sinus pressure.   Respiratory: Positive for cough.       Objective:   Physical Exam  Constitutional: He appears well-developed and well-nourished.  HENT:  Mouth/Throat: No oropharyngeal exudate.  No sinus tenderness.   Neck: Neck supple.  Cardiovascular: Normal rate, regular rhythm and normal heart sounds.   Pulmonary/Chest: Effort normal. He has wheezes.  Abdominal: Soft. Bowel sounds are normal. He exhibits no distension. There is no tenderness.  Lymphadenopathy:    He has no cervical adenopathy.          Assessment & Plan:

## 2014-08-17 NOTE — Assessment & Plan Note (Signed)
Will give him short course of steroids, z-pack for his sinusitis, triggering his asthma

## 2014-08-17 NOTE — Assessment & Plan Note (Signed)
Has been much improved. Feels that is due to his wt loss. Will continue to watch with wt loss.

## 2014-08-17 NOTE — Assessment & Plan Note (Addendum)
Will continue to watch him on his current meds. He is doing very well. Is given flu shot. Hep A #2. Offered/refusd condoms. Consider change to FDC? rtc 6 months.

## 2014-08-17 NOTE — Assessment & Plan Note (Signed)
encouraged him to continue to exercise and lose wt.

## 2014-08-17 NOTE — Assessment & Plan Note (Signed)
He is losing wt and exercising. Will see if his lipids continue to improve. Hold on stating statin.

## 2014-08-17 NOTE — Addendum Note (Signed)
Addended by: Wendall MolaOCKERHAM, JACQUELINE A on: 08/17/2014 04:31 PM   Modules accepted: Orders

## 2014-08-22 ENCOUNTER — Encounter: Payer: BC Managed Care – PPO | Attending: Infectious Diseases | Admitting: Dietician

## 2014-08-22 VITALS — Ht 66.0 in | Wt 198.4 lb

## 2014-08-22 DIAGNOSIS — Z6832 Body mass index (BMI) 32.0-32.9, adult: Secondary | ICD-10-CM | POA: Insufficient documentation

## 2014-08-22 DIAGNOSIS — Z713 Dietary counseling and surveillance: Secondary | ICD-10-CM | POA: Diagnosis not present

## 2014-08-22 DIAGNOSIS — E669 Obesity, unspecified: Secondary | ICD-10-CM | POA: Diagnosis not present

## 2014-08-22 NOTE — Progress Notes (Signed)
  Medical Nutrition Therapy:  Appt start time: 1730 end time:  1800.   Assessment:  Primary concerns today: Ryan [name] returns today with a 15 lbs weight loss since August. No longer having fast food regularly. Having a salad at lunch. Eats whatever he wants to for dinner and on the weekends. Goes to the gym every once in awhile. Drinks water instead juice or sugar sweetened beverages. Has been trying to exercise 2 x week (though has been sick lately).   Preferred Learning Style:   No preference indicated   Learning Readiness:   Ready  MEDICATIONS: see list   DIETARY INTAKE:  Usual eating pattern includes 3 meals and 0 snacks per day.  Avoided foods include: tomatoes, mustard, onions    24-hr recall:  B ( AM): bagel with cream cheese with water Snk ( AM): none  L ( PM): salad with lettuce and dressing sometimes with cheese and raisins Snk ( PM): snack very rarely  D ( PM): lasagna or sausage sandwich with juice, Kool Aid or water Snk ( PM): very rare  Beverages: fruit juice, Kool Aid, water, wine sometimes on weekends (3 cups juice per day)  Usual physical activity: none  Estimated energy needs: 2200 calories 248 g carbohydrates 165 g protein 61 g fat  Progress Towards Goal(s):  In progress.   Nutritional Diagnosis:  [place]-3.3 Overweight/obesity As related to hx of large portions sizes .  As evidenced by BMI of 34.4.    Intervention:  Nutrition counseling provided.  Plan: For breakfast have some protein and carbohydrates (peanut butter and toast, Egg McMuffin, yogurt (less 12 g carbohydrates) with fruit and small amount granola). Have some nuts and raisins or [facility] Protein bars or protein shake available for snacks. Use small plates and bowls. Continue having sugar free drinks.  Add vegetables to lunch and dinner (aim for half your plate). Add lunch meat, boiled eggs, or tuna to salad at lunch.  Have lean protein the size of the palm of your hand. Limit starch to  a quarter of you plate.  Buy frozen unseasoned vegetable or salad greens or baby carrots.  Aim to go to the gym 3 x week (2 weekend days and 1 x week).   Teaching Method Utilized:  Visual Auditory Hands on  Barriers to learning/adherence to lifestyle change: none  Demonstrated degree of understanding via:  Teach Back   Monitoring/Evaluation:  Dietary intake, exercise, and body weight in 3 month(s).

## 2014-08-22 NOTE — Patient Instructions (Addendum)
For breakfast have some protein and carbohydrates (peanut butter and toast, Egg McMuffin, yogurt (less 12 g carbohydrates) with fruit and small amount granola). Have some nuts and raisins or [facility] Protein bars or protein shake available for snacks. Use small plates and bowls. Continue having sugar free drinks.  Add vegetables to lunch and dinner (aim for half your plate). Add lunch meat, boiled eggs, or tuna to salad at lunch.  Have lean protein the size of the palm of your hand. Limit starch to a quarter of you plate.  Buy frozen unseasoned vegetable or salad greens or baby carrots.  Aim to go to the gym 3 x week (2 weekend days and 1 x week).

## 2014-11-21 ENCOUNTER — Ambulatory Visit: Payer: BC Managed Care – PPO | Admitting: Dietician

## 2015-01-04 IMAGING — US US SCROTUM
1 series · 14 of 25 positions shown · non-contrast
Comparison: None.

CLINICAL DATA: Lump in scrotum on the right

EXAM:
ULTRASOUND OF SCROTUM
TECHNIQUE: Complete ultrasound examination of the testicles, epididymis, and
other scrotal structures was performed.

[Series 1: us scrotum · 14 of 37 slices shown]
[im 1/37]
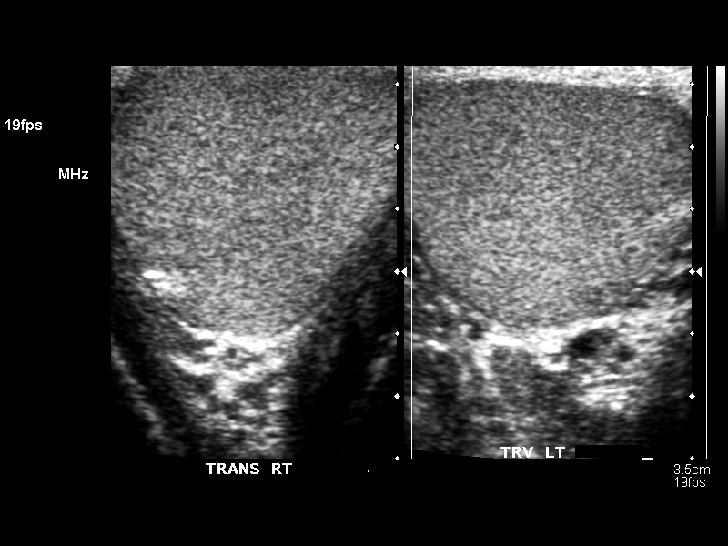
[im 4/37]
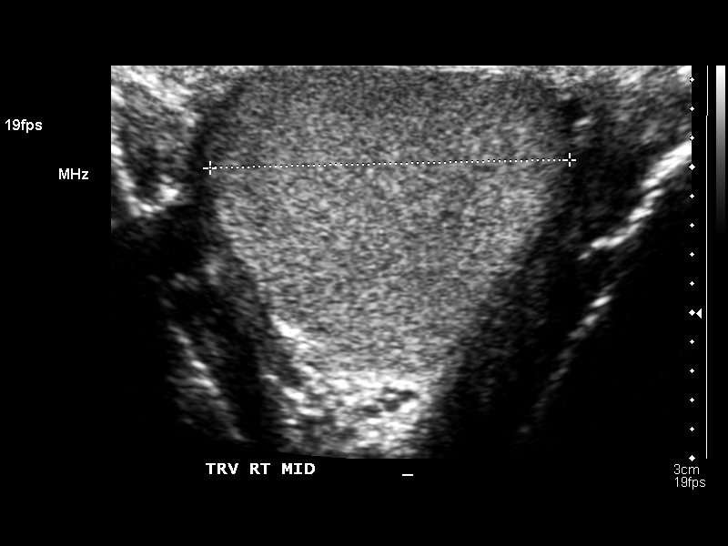
[im 7/37]
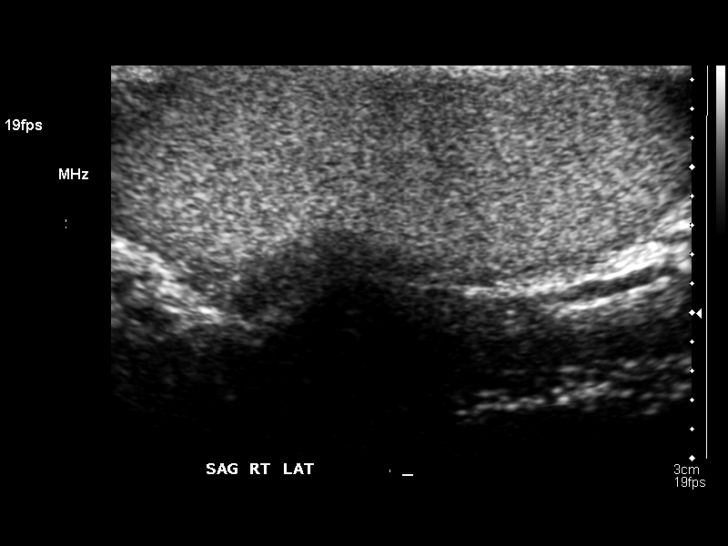
[im 10/37]
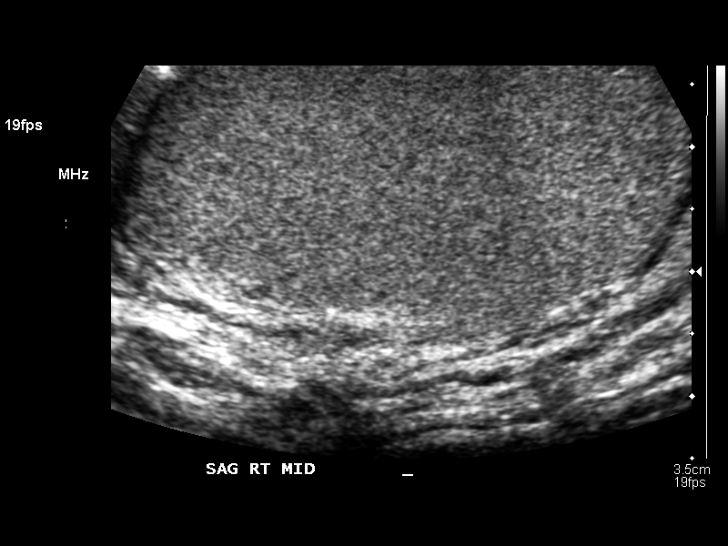
[im 13/37]
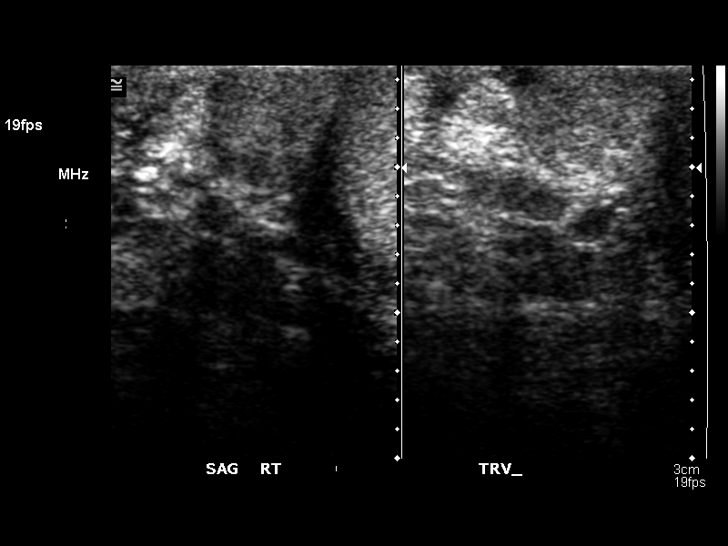
[im 14/37]
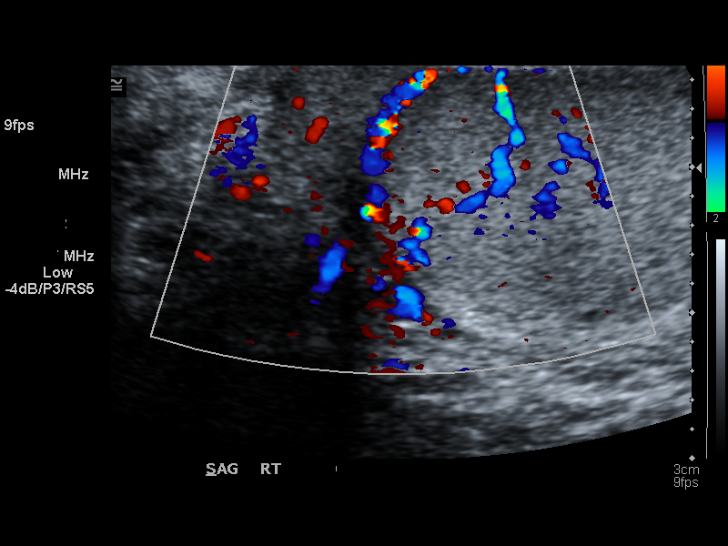
[im 17/37]
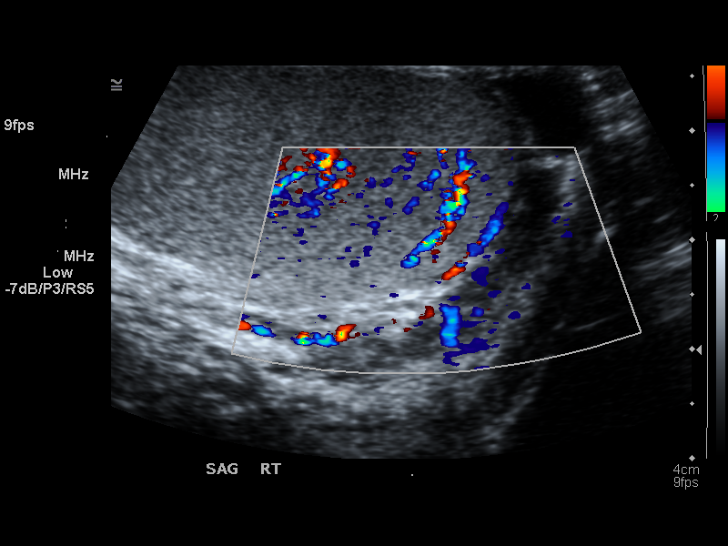
[im 20/37]
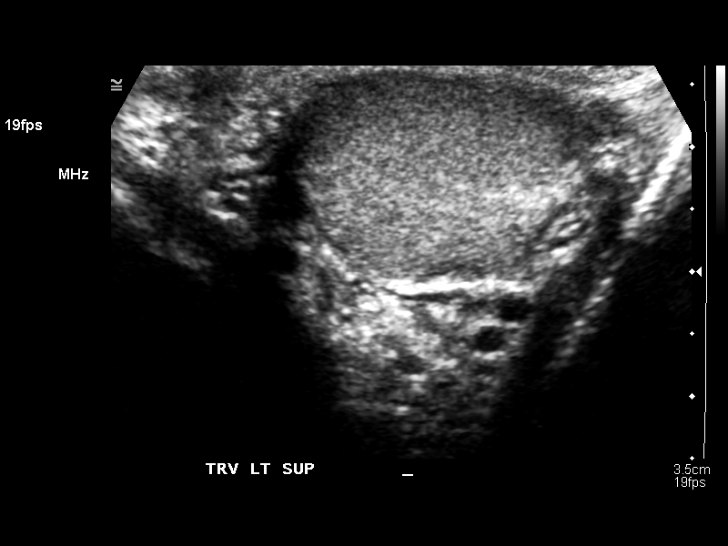
[im 23/37]
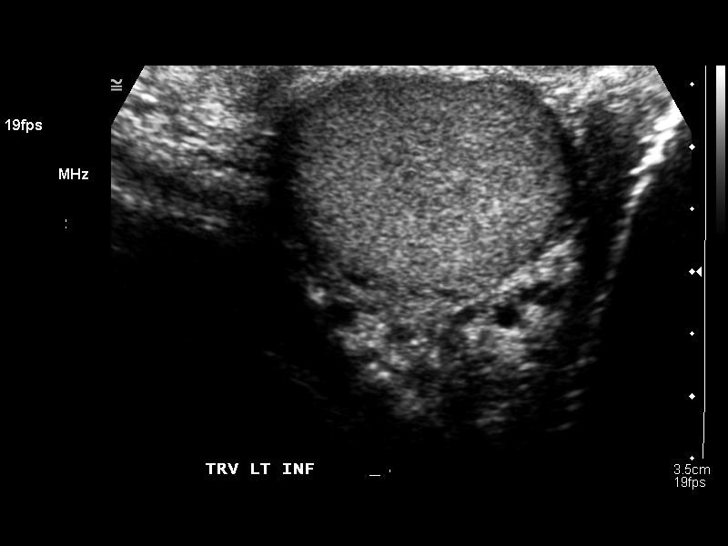
[im 25/37]
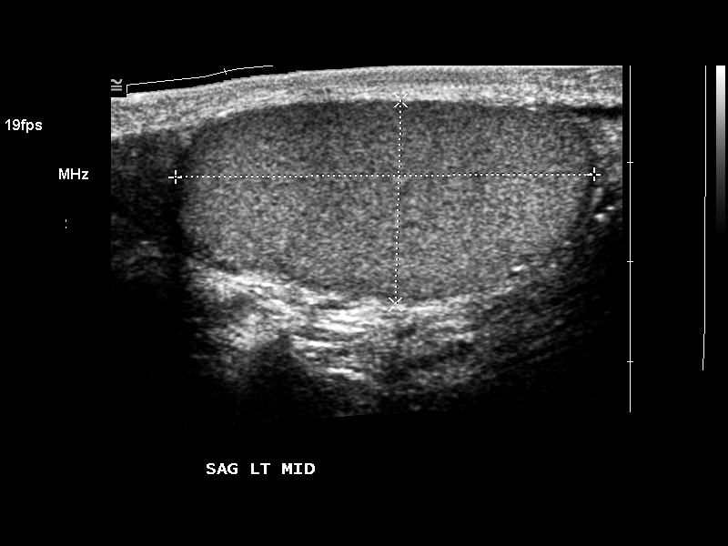
[im 28/37]
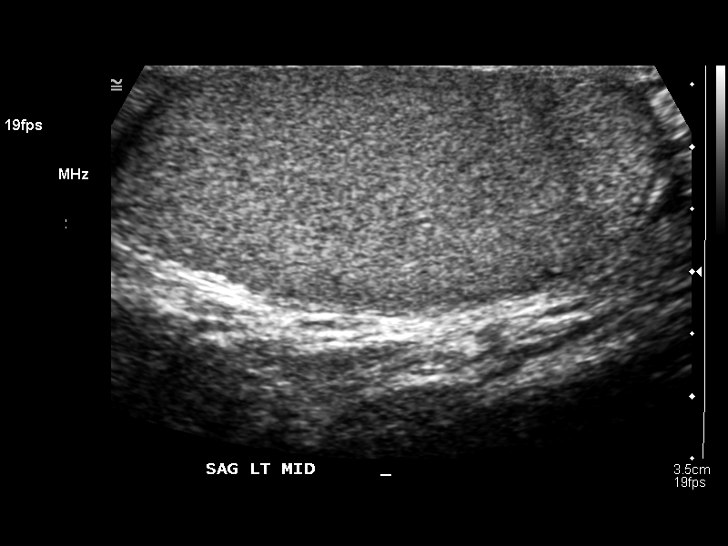
[im 31/37]
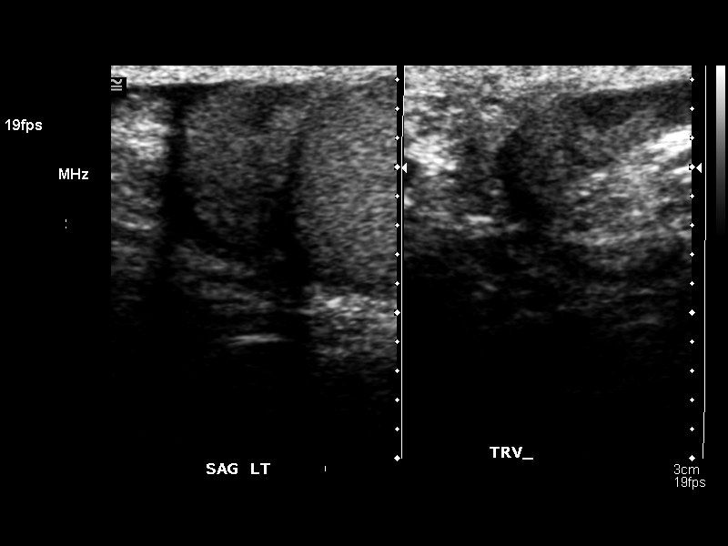
[im 34/37]
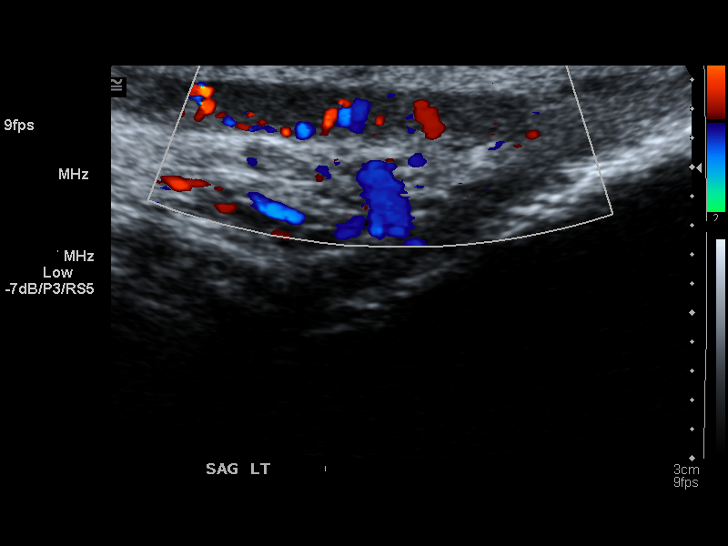
[im 37/37]
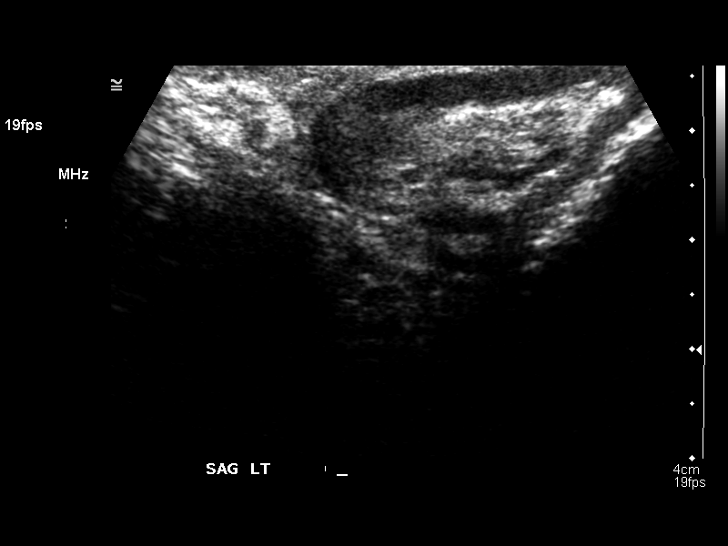

[14 of 25 positions shown; findings below may reference images not displayed]

FINDINGS: Right testicle

Measurements: The right testicle measures 4.4 x 2.3 x 2.5 cm. Blood
flow is demonstrated to the right testicle..

Left testicle

Measurements: The left testicle measures 4.2 x 2.1 x 3.0 cm.. Blood
flow is demonstrated to the left testicle.

Right epididymis:  The right epididymis is unremarkable.

Left epididymis:  The left epididymis is unremarkable.

Hydrocele:  No hydrocele is seen.

Varicocele:  No varicocele is noted.
IMPRESSION: Negative. No evidence for testicular mass or other significant
abnormality.

## 2015-02-18 ENCOUNTER — Other Ambulatory Visit: Payer: Self-pay | Admitting: Infectious Diseases

## 2015-02-18 DIAGNOSIS — B20 Human immunodeficiency virus [HIV] disease: Secondary | ICD-10-CM

## 2015-03-01 IMAGING — CR DG CHEST 2V
2 series · 2 of 2 positions shown · non-contrast
Comparison: None.

CLINICAL DATA: Cough.  Congestion.  Short of breath.

EXAM:
CHEST  2 VIEW

[view not recorded (1 of 2)]
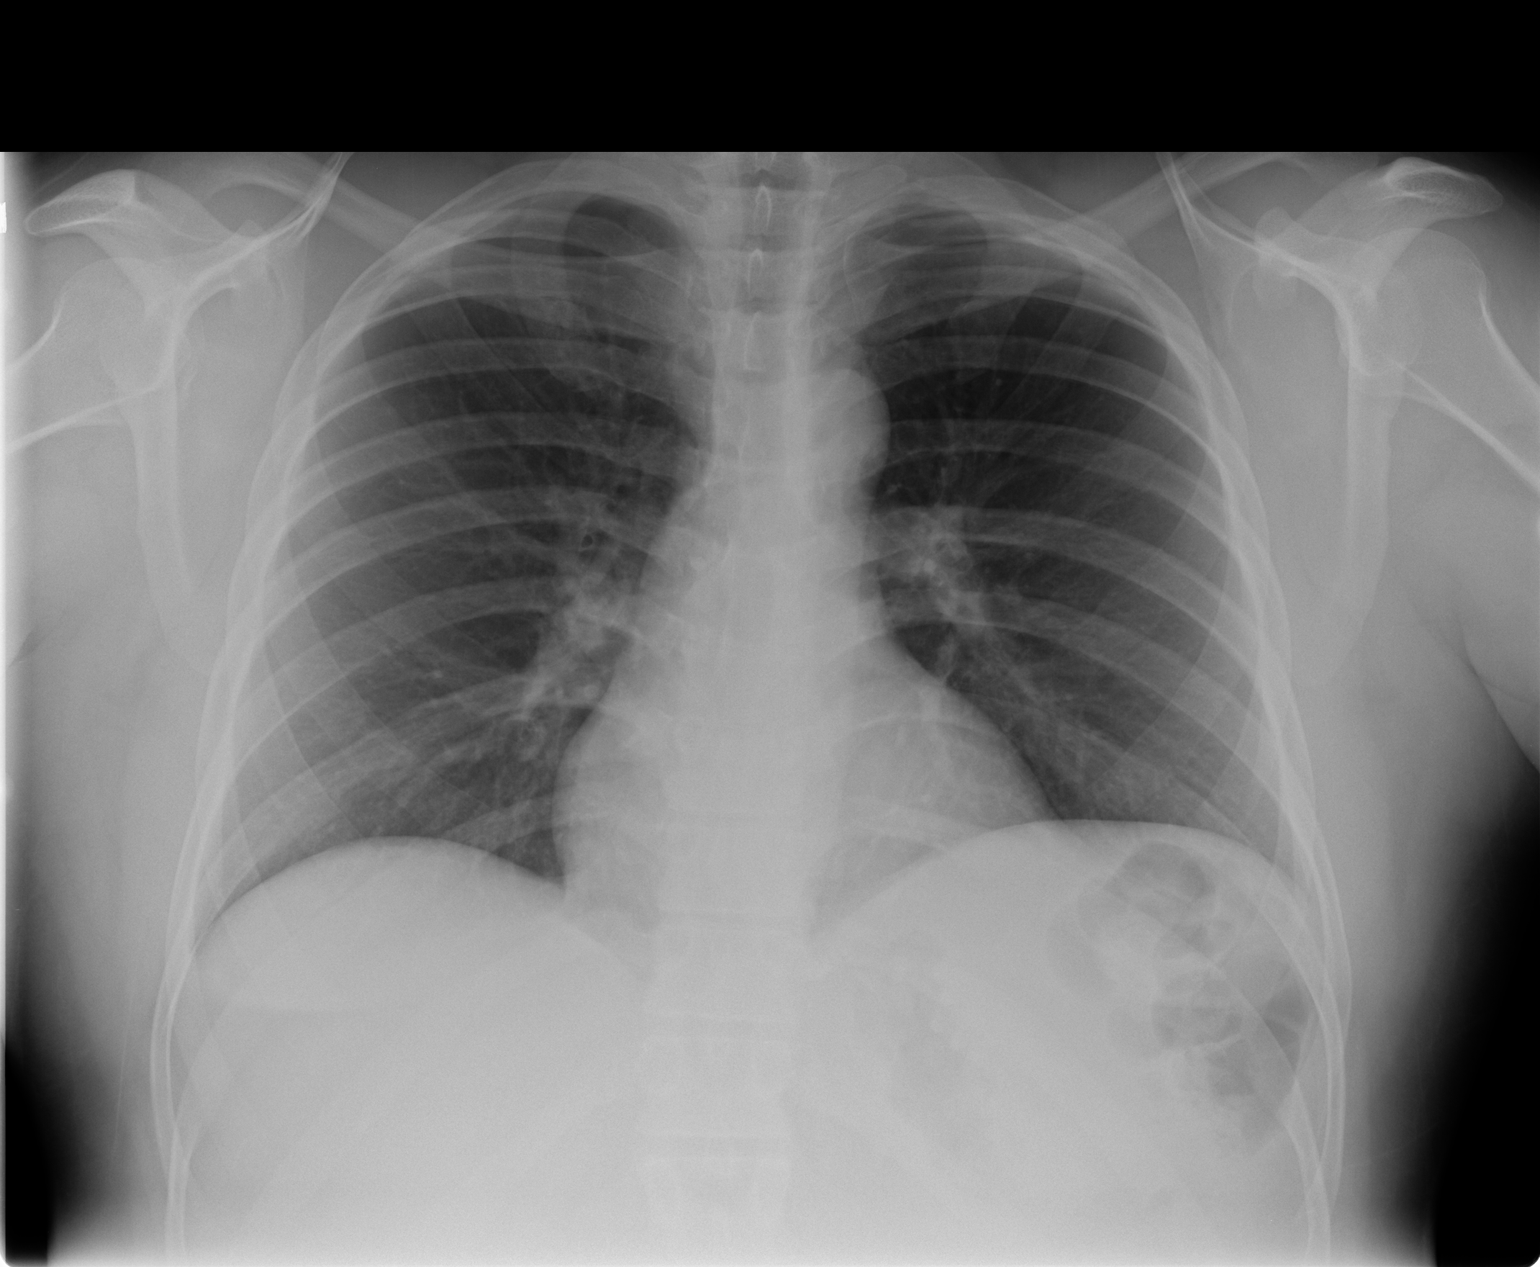

[view not recorded (2 of 2)]
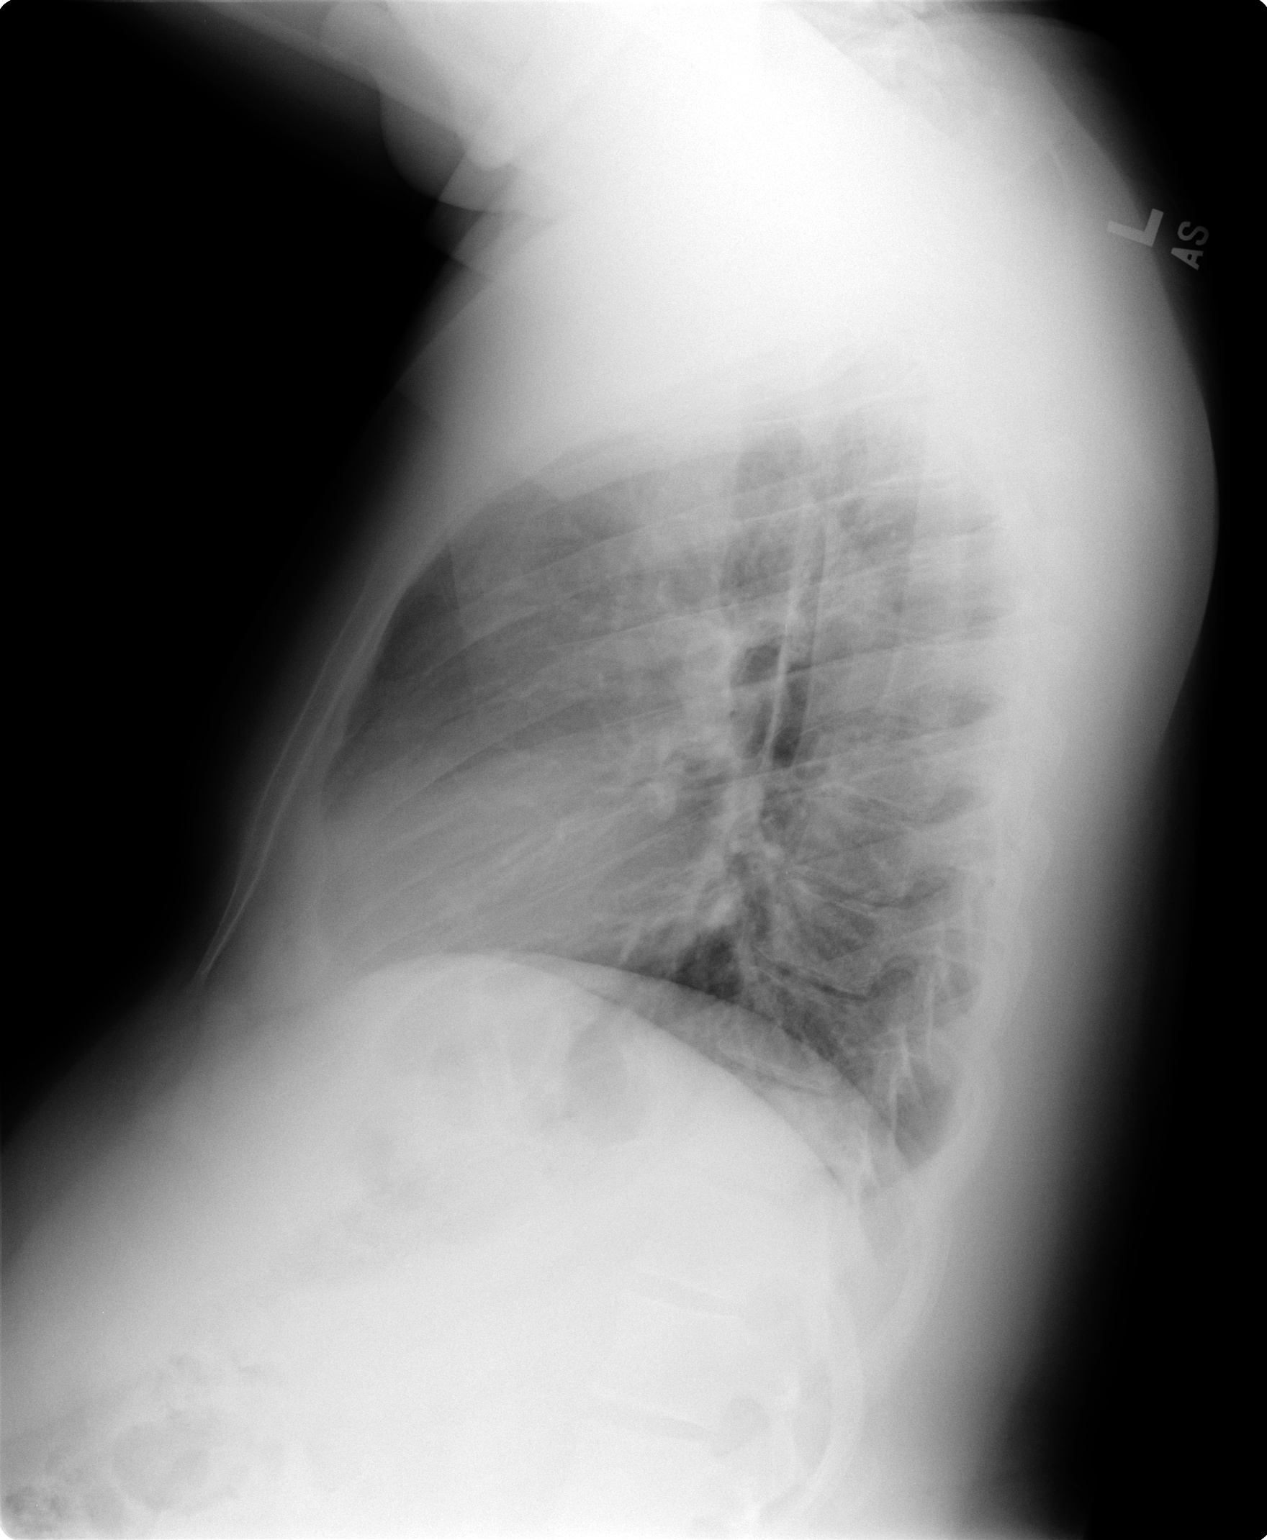

[2 of 2 positions shown; findings below may reference images not displayed]

FINDINGS: The heart size and mediastinal contours are within normal limits.
Both lungs are clear. The visualized skeletal structures are
unremarkable.
IMPRESSION: No active cardiopulmonary disease.

## 2015-03-25 ENCOUNTER — Other Ambulatory Visit: Payer: Self-pay | Admitting: Infectious Diseases

## 2015-04-22 ENCOUNTER — Other Ambulatory Visit: Payer: Self-pay | Admitting: Infectious Diseases

## 2015-05-20 ENCOUNTER — Other Ambulatory Visit: Payer: Self-pay | Admitting: Infectious Diseases

## 2016-02-07 ENCOUNTER — Telehealth: Payer: Self-pay | Admitting: *Deleted

## 2016-02-07 NOTE — Telephone Encounter (Signed)
[facility] for [facility] immun[facility].  [place]
